# Patient Record
Sex: Female | Born: 1990 | Hispanic: Yes | Marital: Single | Smoking: Former smoker
Health system: Southern US, Community
[De-identification: ages and names within clinical notes are randomized; demographics above are authoritative.]

## PROBLEM LIST (undated history)

## (undated) ENCOUNTER — Inpatient Hospital Stay (HOSPITAL_COMMUNITY): Payer: Self-pay

## (undated) DIAGNOSIS — K6289 Other specified diseases of anus and rectum: Secondary | ICD-10-CM

## (undated) DIAGNOSIS — B999 Unspecified infectious disease: Secondary | ICD-10-CM

## (undated) DIAGNOSIS — K603 Anal fistula: Secondary | ICD-10-CM

## (undated) DIAGNOSIS — R87629 Unspecified abnormal cytological findings in specimens from vagina: Secondary | ICD-10-CM

## (undated) DIAGNOSIS — IMO0002 Reserved for concepts with insufficient information to code with codable children: Secondary | ICD-10-CM

## (undated) DIAGNOSIS — K625 Hemorrhage of anus and rectum: Secondary | ICD-10-CM

## (undated) DIAGNOSIS — K219 Gastro-esophageal reflux disease without esophagitis: Secondary | ICD-10-CM

## (undated) HISTORY — PX: LAPAROSCOPIC GASTRIC SLEEVE RESECTION: SHX5895

## (undated) HISTORY — DX: Anal fistula: K60.3

## (undated) HISTORY — DX: Hemorrhage of anus and rectum: K62.5

## (undated) HISTORY — DX: Reserved for concepts with insufficient information to code with codable children: IMO0002

## (undated) HISTORY — DX: Unspecified infectious disease: B99.9

## (undated) HISTORY — DX: Other specified diseases of anus and rectum: K62.89

## (undated) HISTORY — DX: Unspecified abnormal cytological findings in specimens from vagina: R87.629

---
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
## 2011-03-22 ENCOUNTER — Encounter: Payer: Self-pay | Admitting: Internal Medicine

## 2011-03-22 ENCOUNTER — Other Ambulatory Visit (INDEPENDENT_AMBULATORY_CARE_PROVIDER_SITE_OTHER): Payer: BC Managed Care – PPO

## 2011-03-22 ENCOUNTER — Ambulatory Visit (INDEPENDENT_AMBULATORY_CARE_PROVIDER_SITE_OTHER): Payer: BC Managed Care – PPO | Admitting: Internal Medicine

## 2011-03-22 VITALS — BP 124/72 | HR 70 | Temp 97.8°F | Resp 16 | Ht 65.0 in | Wt 256.0 lb

## 2011-03-22 DIAGNOSIS — K5900 Constipation, unspecified: Secondary | ICD-10-CM

## 2011-03-22 DIAGNOSIS — K603 Anal fistula, unspecified: Secondary | ICD-10-CM

## 2011-03-22 DIAGNOSIS — Z23 Encounter for immunization: Secondary | ICD-10-CM

## 2011-03-22 DIAGNOSIS — M26609 Unspecified temporomandibular joint disorder, unspecified side: Secondary | ICD-10-CM

## 2011-03-22 LAB — CBC WITH DIFFERENTIAL/PLATELET
Basophils Absolute: 0 10*3/uL (ref 0.0–0.1)
Basophils Relative: 0.3 % (ref 0.0–3.0)
Eosinophils Absolute: 0.1 10*3/uL (ref 0.0–0.7)
Eosinophils Relative: 1.1 % (ref 0.0–5.0)
HCT: 38.4 % (ref 36.0–46.0)
Hemoglobin: 12.7 g/dL (ref 12.0–15.0)
Lymphocytes Relative: 33.4 % (ref 12.0–46.0)
Lymphs Abs: 2.6 10*3/uL (ref 0.7–4.0)
MCHC: 33 g/dL (ref 30.0–36.0)
MCV: 87.7 fl (ref 78.0–100.0)
Monocytes Absolute: 0.6 10*3/uL (ref 0.1–1.0)
Monocytes Relative: 7 % (ref 3.0–12.0)
Neutro Abs: 4.6 10*3/uL (ref 1.4–7.7)
Neutrophils Relative %: 58.2 % (ref 43.0–77.0)
Platelets: 248 10*3/uL (ref 150.0–400.0)
RBC: 4.38 Mil/uL (ref 3.87–5.11)
RDW: 15.6 % — ABNORMAL HIGH (ref 11.5–14.6)
WBC: 7.9 10*3/uL (ref 4.5–10.5)

## 2011-03-22 LAB — COMPREHENSIVE METABOLIC PANEL
ALT: 19 U/L (ref 0–35)
AST: 19 U/L (ref 0–37)
Albumin: 3.9 g/dL (ref 3.5–5.2)
Alkaline Phosphatase: 69 U/L (ref 39–117)
BUN: 18 mg/dL (ref 6–23)
CO2: 30 mEq/L (ref 19–32)
Calcium: 8.9 mg/dL (ref 8.4–10.5)
Chloride: 103 mEq/L (ref 96–112)
Creatinine, Ser: 0.7 mg/dL (ref 0.4–1.2)
GFR: 123.04 mL/min (ref >60.00)
Glucose, Bld: 94 mg/dL (ref 70–99)
Potassium: 4.4 mEq/L (ref 3.5–5.1)
Sodium: 139 mEq/L (ref 135–145)
Total Bilirubin: 0.5 mg/dL (ref 0.3–1.2)
Total Protein: 7.3 g/dL (ref 6.0–8.3)

## 2011-03-22 LAB — TSH: TSH: 2.29 u[IU]/mL (ref 0.35–5.50)

## 2011-03-22 MED ORDER — POLYETHYLENE GLYCOL 3350 17 GM/SCOOP PO POWD: 17.0000 g | Freq: Every day | ORAL | Status: AC

## 2011-03-22 NOTE — Progress Notes (Signed)
  Subjective:    Patient ID: Marissa Saunders, female    DOB: 1991/04/30, 20 y.o.   MRN: [id number]  Constipation This is a chronic problem. The current episode started more than 1 year ago. The problem is unchanged. Her stool frequency is 1 time per day. The stool is described as firm. The patient is on a high fiber diet. She does not exercise regularly. There has been adequate water intake. Associated symptoms include hematochezia, hemorrhoids and rectal pain. Pertinent negatives include no abdominal pain, anorexia, back pain, bloating, diarrhea, difficulty urinating, fecal incontinence, fever, flatus, melena, nausea, vomiting or weight loss. Risk factors include obesity. She has tried laxatives for the symptoms. The treatment provided mild relief.      Review of Systems  Constitutional: Negative for fever and weight loss.  HENT: Positive for dental problem (tmj is painful, it locks and it clicks). Negative for hearing loss, ear pain, nosebleeds, congestion, sore throat, facial swelling, rhinorrhea, sneezing, drooling, mouth sores, trouble swallowing, neck pain, neck stiffness, voice change, postnasal drip, sinus pressure, tinnitus and ear discharge.   Gastrointestinal: Positive for constipation, blood in stool, hematochezia, anal bleeding, rectal pain and hemorrhoids. Negative for nausea, vomiting, abdominal pain, diarrhea, melena, abdominal distention, bloating, anorexia and flatus.  Genitourinary: Negative for difficulty urinating.  Musculoskeletal: Negative for back pain.       Objective:   Physical Exam  Vitals reviewed. Constitutional: She is oriented to person, place, and time. She appears well-developed and well-nourished. No distress.  HENT:  Mouth/Throat: Oropharynx is clear and moist and mucous membranes are normal. Mucous membranes are not pale, not dry and not cyanotic. No oropharyngeal exudate, posterior oropharyngeal edema, posterior oropharyngeal erythema or tonsillar abscesses.        There is crepitance and popping in both TMJ's but she has good ROM  Eyes: Conjunctivae are normal. Right eye exhibits no discharge. Left eye exhibits no discharge. No scleral icterus.  Neck: Normal range of motion. Neck supple. No JVD present. No tracheal deviation present. No thyromegaly present.  Cardiovascular: Normal rate, regular rhythm, normal heart sounds and intact distal pulses.  Exam reveals no gallop and no friction rub.   No murmur heard. Pulmonary/Chest: Effort normal and breath sounds normal. No stridor. No respiratory distress. She has no wheezes. She has no rales. She exhibits no tenderness.  Abdominal: Soft. Normal appearance and bowel sounds are normal. She exhibits no distension and no mass. There is no hepatosplenomegaly. There is no tenderness. There is no rebound, no guarding and no CVA tenderness.  Genitourinary: Rectal exam shows internal hemorrhoid and fissure (fistula from the posterior peri-anal area). Rectal exam shows no external hemorrhoid, no mass, no tenderness and anal tone normal. Guaiac negative stool. Right adnexum displays no mass, no tenderness and no fullness. Left adnexum displays no mass, no tenderness and no fullness.  Musculoskeletal: Normal range of motion. She exhibits no edema and no tenderness.  Lymphadenopathy:    She has no cervical adenopathy.  Neurological: She is oriented to person, place, and time. She displays normal reflexes. She exhibits normal muscle tone. Coordination normal.  Skin: Skin is warm and dry. No rash noted. She is not diaphoretic. No erythema. No pallor.  Psychiatric: She has a normal mood and affect. Her behavior is normal. Judgment and thought content normal.          Assessment & Plan:

## 2011-03-22 NOTE — Assessment & Plan Note (Signed)
Start miralax and check labs to look for secondary causes 

## 2011-03-22 NOTE — Assessment & Plan Note (Signed)
General surgery referral to see if she needs a repair

## 2011-03-22 NOTE — Patient Instructions (Signed)

## 2011-03-22 NOTE — Assessment & Plan Note (Signed)
Oral surgery referral

## 2011-04-03 ENCOUNTER — Ambulatory Visit (INDEPENDENT_AMBULATORY_CARE_PROVIDER_SITE_OTHER): Payer: BC Managed Care – PPO | Admitting: Surgery

## 2011-04-03 ENCOUNTER — Encounter (INDEPENDENT_AMBULATORY_CARE_PROVIDER_SITE_OTHER): Payer: Self-pay | Admitting: Surgery

## 2011-04-03 VITALS — BP 126/78 | HR 60 | Temp 97.2°F | Resp 18 | Ht 65.0 in | Wt 257.5 lb

## 2011-04-03 DIAGNOSIS — K602 Anal fissure, unspecified: Secondary | ICD-10-CM

## 2011-04-03 DIAGNOSIS — K603 Anal fistula: Secondary | ICD-10-CM

## 2011-04-03 DIAGNOSIS — K601 Chronic anal fissure: Secondary | ICD-10-CM | POA: Insufficient documentation

## 2011-04-03 NOTE — Progress Notes (Signed)
Chief Complaint  Patient presents with  . Other    new pt eval of anal fissure     HPI Marissa Saunders is a 20 y.o. female.  HPI The patient is sent today at the request of Dr. Yetta Barre due to an anal fissure. She has a one-year history of rectal bleeding, constipation, intermittent drainage and intermittent pain in her anal canal. She denies any fever or chills. She states she had a small mass on the outside of her anus about a year ago which began to bleed in drain. This has gotten better but she has persistent pain after bowel movements and bleeding after bowel movements.  Past Medical History  Diagnosis Date  . Anal bleeding   . Rectal pain   . Constipation     History reviewed. No pertinent past surgical history.  Family History  Problem Relation Age of Onset  . Hypertension Mother   . Diabetes Mother   . Hyperlipidemia Father   . Hypertension Father   . Cancer Neg Hx     Social History History  Substance Use Topics  . Smoking status: Never Smoker   . Smokeless tobacco: Never Used  . Alcohol Use: 3.0 oz/week    5 Shots of liquor per week    No Known Allergies  Current Outpatient Prescriptions  Medication Sig Dispense Refill  . Polyethylene Glycol 3350 (MIRALAX PO) Take by mouth daily.          Review of Systems Review of Systems  Constitutional: Negative.   HENT: Negative.   Eyes: Negative.   Respiratory: Negative.   Cardiovascular: Negative.   Gastrointestinal: Negative.   Genitourinary: Negative.   Musculoskeletal: Negative.   Skin: Negative.   Neurological: Negative.   Hematological: Negative.   Psychiatric/Behavioral: Negative.     Blood pressure 126/78, pulse 60, temperature 97.2 F (36.2 C), resp. rate 18, height 5\' 5"  (1.651 m), weight 257 lb 8 oz (116.801 kg), last menstrual period 03/08/2011.  Physical Exam Physical Exam  Constitutional: She is oriented to person, place, and time. She appears well-developed and well-nourished.  HENT:    Head: Normocephalic and atraumatic.  Eyes: Conjunctivae and EOM are normal. Pupils are equal, round, and reactive to light.  Neck: Normal range of motion. Neck supple.  Cardiovascular: Normal rate, regular rhythm and normal heart sounds.   Pulmonary/Chest: Effort normal and breath sounds normal.  Genitourinary:       Posterior midline anal fissure noted.  Chronic.  Posterior opening is skin consistent with fistula in ano.  No redness or mass.skin tags noted.  Neurological: She is alert and oriented to person, place, and time.  Skin: Skin is warm and dry.  Psychiatric: She has a normal mood and affect. Her behavior is normal. Judgment and thought content normal.    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 The procedure has been discussed with the patient. The risks,  benefits and alternatives to surgery have been discussed. The patient understands the reason for the procedure and agrees to proceed. All questions are answered today to the best of my ability. Complications of bleeding, infection,  Recurrence,  Incontinence and more surgery. She agrees to proceed.       Machai Desmith A. 04/03/2011, 4:57 PM

## 2011-04-03 NOTE — Patient Instructions (Signed)
Anal Fissure     An anal fissure often begins with sharp pain. This is usually following a bowel movement. It often causes bright red blood stained stools. It is the most common cause of rectal bleeding. One common cause of this is passage of a large, hard stool. It can also be caused by having frequent diarrheal stools. Anal fissures that occur for a longtime (chronic) may require surgery.     CAUSES   Passing large, hard stools.   Frequent diarrheal stools.   Constipation.     SYMPTOMS   Bright red, blood stained stools.   Rectal bleeding.     HOME CARE INSTRUCTIONS   If constipation is the cause of the rectal fissure, it may be necessary to add a bulk-forming laxative. A diet high in fruits, whole grains, and vegetables will also help.   Taking hot sitz baths for 1 half hour 4 times per day may help.   Increase your fluid intake.   Only take over-the-counter or prescription medicines for pain, discomfort, or fever as directed by your caregiver. Do not take aspirin as this may increase the tendency for bleeding.   Do not use ointments containing anesthetic medications or hydrocortisone. They could slow healing.   Avoid constipating foods such as bananas and cheese.   In children, brown Karo syrup may be used by adding 1 teaspoon of syrup to 8 ounces of formula. An alternative is to give 3 teaspoons of mineral oil every day.     SEEK MEDICAL CARE IF:  Rectal bleeding continues, changes in intensity, or becomes more severe.     MAKE SURE YOU:     Understand these instructions.     Will watch your condition.   Will get help right away if you are not doing well or get worse.     Document Released: 06/17/2005  Document Re-Released: 09/11/2009  Exit[employer] Patient Information 2011 [employer].

## 2011-05-02 DIAGNOSIS — K603 Anal fistula, unspecified: Secondary | ICD-10-CM

## 2011-05-02 DIAGNOSIS — K602 Anal fissure, unspecified: Secondary | ICD-10-CM

## 2011-05-02 HISTORY — PX: ANAL FISSURE REPAIR: SHX2312

## 2011-05-06 ENCOUNTER — Telehealth (INDEPENDENT_AMBULATORY_CARE_PROVIDER_SITE_OTHER): Payer: Self-pay | Admitting: Surgery

## 2011-05-07 NOTE — Telephone Encounter (Signed)
APPOINTMENT MADE WITH DR. Luisa Hart ON 05-17-11./ PT AWARE

## 2011-05-08 ENCOUNTER — Telehealth (INDEPENDENT_AMBULATORY_CARE_PROVIDER_SITE_OTHER): Payer: Self-pay

## 2011-05-08 NOTE — Telephone Encounter (Signed)
Pt called with questions [id number]. Pt states still having drainage and small amount of bleeding. No fever. No swelling. Pt advised that some drainage and spotting is common after fistulotomy. Pt advised if large amount of bleeding, swelling,fever or increase in pain pt needs to call and advise our office. Pt to continue tub soaks,using wet wipes.

## 2011-05-17 ENCOUNTER — Ambulatory Visit (INDEPENDENT_AMBULATORY_CARE_PROVIDER_SITE_OTHER): Payer: BC Managed Care – PPO | Admitting: Surgery

## 2011-05-17 ENCOUNTER — Encounter (INDEPENDENT_AMBULATORY_CARE_PROVIDER_SITE_OTHER): Payer: Self-pay | Admitting: Surgery

## 2011-05-17 VITALS — BP 126/72 | HR 68 | Temp 97.5°F | Resp 16 | Ht 65.0 in | Wt 252.4 lb

## 2011-05-17 DIAGNOSIS — Z9889 Other specified postprocedural states: Secondary | ICD-10-CM

## 2011-05-17 NOTE — Progress Notes (Signed)
The patient returns today after fistulectomy for a fistula in ano. She does have some mild incontinence gas. She is able to control her bowel function otherwise.  Exam: Fistulotomy site clean dry and intact. Minimal fibrinous exudate. No redness or abscess.  Impression: Status post fistulotomy for fistula in ano  Plan: Return to clinic one month. Decreased amount of carbonation and diet. Continue local wound care.

## 2011-05-17 NOTE — Patient Instructions (Signed)
Follow up 1 month.  Excessive flatus will resolve in next 2 -3 months.  Avoid excesive carbonation.

## 2011-06-12 ENCOUNTER — Encounter (INDEPENDENT_AMBULATORY_CARE_PROVIDER_SITE_OTHER): Payer: Self-pay | Admitting: Surgery

## 2011-06-12 ENCOUNTER — Ambulatory Visit (INDEPENDENT_AMBULATORY_CARE_PROVIDER_SITE_OTHER): Payer: BC Managed Care – PPO | Admitting: Surgery

## 2011-06-12 VITALS — BP 142/98 | HR 64 | Temp 96.9°F | Resp 19 | Ht 65.0 in | Wt 245.4 lb

## 2011-06-12 DIAGNOSIS — Z9889 Other specified postprocedural states: Secondary | ICD-10-CM

## 2011-06-12 NOTE — Progress Notes (Signed)
Patient returns after fistulotomy or fistula in ano. She is doing well.  Exam: Fistulotomy wound healing well. No signs of infection.  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

## 2011-06-12 NOTE — Patient Instructions (Signed)
Expect drainage for 2- 3 more months.  Return as needed.

## 2012-02-12 ENCOUNTER — Encounter: Payer: Self-pay | Admitting: Obstetrics and Gynecology

## 2012-03-17 ENCOUNTER — Ambulatory Visit (INDEPENDENT_AMBULATORY_CARE_PROVIDER_SITE_OTHER): Payer: BC Managed Care – PPO | Admitting: Obstetrics and Gynecology

## 2012-03-17 DIAGNOSIS — Z331 Pregnant state, incidental: Secondary | ICD-10-CM

## 2012-03-17 DIAGNOSIS — Z3689 Encounter for other specified antenatal screening: Secondary | ICD-10-CM

## 2012-03-17 LAB — POCT URINALYSIS DIPSTICK
Bilirubin, UA: NEGATIVE
Blood, UA: NEGATIVE
Glucose, UA: NEGATIVE
Ketones, UA: 1
Nitrite, UA: NEGATIVE
Protein, UA: NEGATIVE
Spec Grav, UA: 1.025
Urobilinogen, UA: NEGATIVE
pH, UA: 5

## 2012-03-17 LAB — OB RESULTS CONSOLE GBS: GBS: POSITIVE

## 2012-03-17 NOTE — Progress Notes (Signed)
NOB interview completed.  Pt is [redacted]w[redacted]d today, no previous prenatal care.  Scheduled for NOB work up on Friday 03/20/12 w/ VL, scheduled anatomy scan for Friday 03/27/12 @ 1330 f/u w/ DD.  Quad screen drawn today.

## 2012-03-18 LAB — PRENATAL PANEL VII
Antibody Screen: NEGATIVE
Basophils Absolute: 0 10*3/uL (ref 0.0–0.1)
Basophils Relative: 0 % (ref 0–1)
Eosinophils Absolute: 0.1 10*3/uL (ref 0.0–0.7)
Eosinophils Relative: 1 % (ref 0–5)
HCT: 34.8 % — ABNORMAL LOW (ref 36.0–46.0)
HIV: NONREACTIVE
Hemoglobin: 11.6 g/dL — ABNORMAL LOW (ref 12.0–15.0)
Hepatitis B Surface Ag: NEGATIVE
Lymphocytes Relative: 26 % (ref 12–46)
Lymphs Abs: 2.6 10*3/uL (ref 0.7–4.0)
MCH: 28.9 pg (ref 26.0–34.0)
MCHC: 33.3 g/dL (ref 30.0–36.0)
MCV: 86.6 fL (ref 78.0–100.0)
Monocytes Absolute: 0.7 10*3/uL (ref 0.1–1.0)
Monocytes Relative: 7 % (ref 3–12)
Neutro Abs: 6.7 10*3/uL (ref 1.7–7.7)
Neutrophils Relative %: 66 % (ref 43–77)
Platelets: 240 10*3/uL (ref 150–400)
RBC: 4.02 MIL/uL (ref 3.87–5.11)
RDW: 13.5 % (ref 11.5–15.5)
Rh Type: POSITIVE
Rubella: 120.9 IU/mL — ABNORMAL HIGH
WBC: 10.1 10*3/uL (ref 4.0–10.5)

## 2012-03-18 LAB — AFP, QUAD SCREEN
AFP: 34.1 IU/mL
Age Alone: 1:1160 {titer}
Curr Gest Age: 18.5 wks.days
Down Syndrome Scr Risk Est: 1:3200 {titer}
HCG, Total: 19950 m[IU]/mL
INH: 134.3 pg/mL
Interpretation-AFP: NEGATIVE
MoM for AFP: 1.29
MoM for INH: 1.17
MoM for hCG: 1.64
Open Spina bifida: NEGATIVE
Osb Risk: 1:5160 {titer}
Tri 18 Scr Risk Est: NEGATIVE
Trisomy 18 (Edward) Syndrome Interp.: 1:25600 {titer}
uE3 Mom: 0.58
uE3 Value: 0.5 ng/mL

## 2012-03-19 LAB — HEMOGLOBINOPATHY EVALUATION
Hemoglobin Other: 0 %
Hgb A2 Quant: 3.2 % (ref 2.2–3.2)
Hgb A: 96.8 % (ref 96.8–97.8)
Hgb F Quant: 0 % (ref 0.0–2.0)
Hgb S Quant: 0 %

## 2012-03-20 ENCOUNTER — Ambulatory Visit (INDEPENDENT_AMBULATORY_CARE_PROVIDER_SITE_OTHER): Payer: BC Managed Care – PPO | Admitting: Obstetrics and Gynecology

## 2012-03-20 ENCOUNTER — Encounter: Payer: Self-pay | Admitting: Obstetrics and Gynecology

## 2012-03-20 VITALS — BP 132/62 | Wt 280.0 lb

## 2012-03-20 DIAGNOSIS — Z6838 Body mass index (BMI) 38.0-38.9, adult: Secondary | ICD-10-CM

## 2012-03-20 DIAGNOSIS — IMO0002 Reserved for concepts with insufficient information to code with codable children: Secondary | ICD-10-CM

## 2012-03-20 DIAGNOSIS — Z331 Pregnant state, incidental: Secondary | ICD-10-CM

## 2012-03-20 DIAGNOSIS — Z22330 Carrier of Group B streptococcus: Secondary | ICD-10-CM | POA: Insufficient documentation

## 2012-03-20 LAB — CULTURE, OB URINE: Colony Count: 5000

## 2012-03-20 NOTE — Progress Notes (Signed)
[redacted]w[redacted]d New OB

## 2012-03-20 NOTE — Addendum Note (Signed)
Addended by: Darien Ramus on: 03/20/2012 03:18 PM   Modules accepted: Orders

## 2012-03-20 NOTE — Progress Notes (Signed)
Subjective:    Marissa Saunders is being seen today for her first obstetrical visit at [redacted]w[redacted]d gestation by LMP.  No previous care.  Has [place] for anatomy scheduled 03/27/12. Quad screen done at NOB interview--WNL.  Had +GBS by urine culture at NOB interview--low colony count, will Rx in labor.  Patient Active Problem List  Diagnosis  . TMJ (temporomandibular joint disorder)  . Constipation  . Fistula, anal  . Chronic anal fissure  . Fistula-in-ano  . GBS carrier     She reports doing well, no c/o.  Occasional lower abdominal "pulling" sensation, but very sporadic. No bleeding.  Her obstetrical history is significant for: Patient Active Problem List  Diagnosis  . TMJ (temporomandibular joint disorder)  . Constipation  . Fistula, anal  . Chronic anal fissure  . Fistula-in-ano  . GBS carrier  No current issues with anal fistula.  Relationship with FOB:  Boyfriend.  Patient is not sure of plan for infant feeding.  Pregnancy history fully reviewed.  The following portions of the patient's history were reviewed and updated as appropriate: allergies, current medications, past family history, past medical history, past social history, past surgical history and problem list.  Review of Systems Pertinent ROS is described in HPI   Objective:   LMP 11/07/2011 Wt Readings from Last 1 Encounters:  06/12/11 245 lb 6.4 oz (111.313 kg)   BMI: There is no height or weight on file to calculate BMI.  General: alert, cooperative and no distress Respiratory: clear to auscultation bilaterally Cardiovascular: regular rate and rhythm, S1, S2 normal, no murmur Breasts:  No dominant masses, nipples erect Gastrointestinal: soft, non-tender; no masses,  no organomegaly Extremities: extremities normal, no pain or edema Vaginal Bleeding: None  EXTERNAL GENITALIA: normal appearing vulva with no masses, tenderness or lesions VAGINA: no abnormal discharge or lesions CERVIX: no lesions or cervical  motion tenderness; cervix closed, long, firm UTERUS: gravid and consistent with 19 weeks ADNEXA: no masses palpable and nontender OB EXAM PELVIMETRY: appears adequate Significant pannus.   FHR:  150  bpm  Assessment:    Pregnancy at  19 1/7 weeks Late to care +GBS bacteruria Elevated BMI Plan:     Prenatal panel reviewed and discussed with the patient:yes Pap smear collected:yes GC/Chlamydia collected:yes Wet prep:  Neg Discussion of Genetic testing options: Had Quad screen--WNL Prenatal vitamins recommended Problem list reviewed and updated.  Plan of care: Follow up in 1 week for anatomy US and glucola.  Nigel Bridgeman CNM, [place] 03/20/2012 12:44 PM

## 2012-03-23 ENCOUNTER — Encounter: Payer: Self-pay | Admitting: Obstetrics and Gynecology

## 2012-03-24 LAB — PAP IG, CT-NG, RFX HPV ASCU
Chlamydia Probe Amp: NEGATIVE
GC Probe Amp: NEGATIVE

## 2012-03-25 ENCOUNTER — Encounter: Payer: Self-pay | Admitting: Obstetrics and Gynecology

## 2012-03-25 DIAGNOSIS — O0930 Supervision of pregnancy with insufficient antenatal care, unspecified trimester: Secondary | ICD-10-CM | POA: Insufficient documentation

## 2012-03-25 DIAGNOSIS — R87612 Low grade squamous intraepithelial lesion on cytologic smear of cervix (LGSIL): Secondary | ICD-10-CM

## 2012-03-25 DIAGNOSIS — IMO0002 Reserved for concepts with insufficient information to code with codable children: Secondary | ICD-10-CM

## 2012-03-25 HISTORY — DX: Low grade squamous intraepithelial lesion on cytologic smear of cervix (LGSIL): R87.612

## 2012-03-25 HISTORY — DX: Reserved for concepts with insufficient information to code with codable children: IMO0002

## 2012-03-26 ENCOUNTER — Telehealth: Payer: Self-pay

## 2012-03-27 ENCOUNTER — Telehealth: Payer: Self-pay

## 2012-03-27 ENCOUNTER — Other Ambulatory Visit: Payer: BC Managed Care – PPO

## 2012-03-27 ENCOUNTER — Ambulatory Visit (INDEPENDENT_AMBULATORY_CARE_PROVIDER_SITE_OTHER): Payer: BC Managed Care – PPO

## 2012-03-27 ENCOUNTER — Ambulatory Visit (INDEPENDENT_AMBULATORY_CARE_PROVIDER_SITE_OTHER): Payer: BC Managed Care – PPO | Admitting: Obstetrics and Gynecology

## 2012-03-27 ENCOUNTER — Encounter: Payer: Self-pay | Admitting: Obstetrics and Gynecology

## 2012-03-27 VITALS — BP 112/62 | Wt 279.0 lb

## 2012-03-27 DIAGNOSIS — Z3689 Encounter for other specified antenatal screening: Secondary | ICD-10-CM

## 2012-03-27 DIAGNOSIS — Z331 Pregnant state, incidental: Secondary | ICD-10-CM

## 2012-03-27 LAB — US OB COMP + 14 WK

## 2012-03-27 NOTE — Progress Notes (Signed)
[redacted]w[redacted]d Vx presentation, Posterior Fundal placenta. Placenta Edge is 8.0 cm from internal os normal (Fluid is normal) Marginal Placenta Cord insertion and Hypocoiled Umbilical cord is noted. Cardiac activity noted. No anomalies seen.Feet seen female gender. No oavaries, No fluid in CDS, Normal Adnexas Suggest follow up at 28 weeks for linear growth. No complaints. No change in Vaginal secretions.

## 2012-03-27 NOTE — Telephone Encounter (Signed)
Message copied by Janeece Agee on Fri Mar 27, 2012  3:29 PM ------      Message from: Cornelius Moras      Created: Wed Mar 25, 2012  5:46 AM       Needs colpo      Thanks!

## 2012-03-27 NOTE — Telephone Encounter (Signed)
Unable to lm on vm

## 2012-03-27 NOTE — Progress Notes (Signed)
Pt states no concerns today.   

## 2012-03-27 NOTE — Progress Notes (Incomplete)
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

## 2012-04-01 NOTE — Telephone Encounter (Signed)
Spoke with pt informing her colposcopy is needed due to abn pap smear. Appt scheduled 04/13/12 Oct 14th with AVS. Pt agrees and voices understanding.

## 2012-04-13 ENCOUNTER — Encounter: Payer: Self-pay | Admitting: Obstetrics and Gynecology

## 2012-04-13 ENCOUNTER — Ambulatory Visit (INDEPENDENT_AMBULATORY_CARE_PROVIDER_SITE_OTHER): Payer: Medicaid Other | Admitting: Obstetrics and Gynecology

## 2012-04-13 ENCOUNTER — Ambulatory Visit: Payer: BC Managed Care – PPO

## 2012-04-13 VITALS — BP 124/68 | Wt 288.0 lb

## 2012-04-13 DIAGNOSIS — R6889 Other general symptoms and signs: Secondary | ICD-10-CM

## 2012-04-13 DIAGNOSIS — B977 Papillomavirus as the cause of diseases classified elsewhere: Secondary | ICD-10-CM

## 2012-04-13 DIAGNOSIS — IMO0002 Reserved for concepts with insufficient information to code with codable children: Secondary | ICD-10-CM

## 2012-04-13 NOTE — Progress Notes (Signed)
HISTORY OF PRESENT ILLNESS  [name] is a 21 y.o. year old female,G1P0, who presents for a problem visit. The patient presents at 22 weeks and 4 days gestation.  Her Pap smear showed CIN-1 with HPV.  Subjective:  Doing well  Objective:  BP 124/68  Wt 288 lb (130.636 kg)  LMP 11/07/2011   GI: soft and nontender, gravid, fundal height 23 cm, normal fetal heart tones  Exam deferred.  Assessment:  Pap smear with CIN-1  HPV  22 week and 4 day gestation  Plan:  A long discussion was held with the patient about abnormal Pap smears and HPV.  I still recommend Gardasil vaccination.  Repeat Pap smear per new guidelines in September 2014.  Return to office in 2 week(s).   Leonard Schwartz M.D.  04/13/2012 2:10 PM    Murrell Converse Pap Smear: 03/20/12 LSIL MILD DYSPLASIA CIN1  Previous Colposcopy: n/a Referred From: n/a LMP: 11/07/11 Contraception: none, currently pregnant  G,P: 1, 0 Pt states no complaints with pregnancy, no swelling, bleeding, or discharge. Pt states baby's movement is normal. Urine negative for protein and glucose. FHT 140

## 2012-04-24 ENCOUNTER — Encounter: Payer: Self-pay | Admitting: Obstetrics and Gynecology

## 2012-04-24 ENCOUNTER — Ambulatory Visit (INDEPENDENT_AMBULATORY_CARE_PROVIDER_SITE_OTHER): Payer: Medicaid Other | Admitting: Obstetrics and Gynecology

## 2012-04-24 VITALS — BP 120/60 | Wt 290.0 lb

## 2012-04-24 DIAGNOSIS — IMO0002 Reserved for concepts with insufficient information to code with codable children: Secondary | ICD-10-CM

## 2012-04-24 DIAGNOSIS — R6889 Other general symptoms and signs: Secondary | ICD-10-CM

## 2012-04-24 DIAGNOSIS — Z331 Pregnant state, incidental: Secondary | ICD-10-CM

## 2012-04-24 DIAGNOSIS — Z23 Encounter for immunization: Secondary | ICD-10-CM

## 2012-04-24 NOTE — Progress Notes (Signed)
[redacted]w[redacted]d No complaints per pt  Flu shot given without difficulty

## 2012-04-24 NOTE — Addendum Note (Signed)
Addended by: Janeece Agee on: 04/24/2012 12:51 PM   Modules accepted: Orders

## 2012-04-24 NOTE — Progress Notes (Signed)
Doing well. Reviewed weight gain from LV--glucola [place] at [facility] for growth/fluid due to hypercoiled cord and marginal insertion. Reviewed plan per AVS for pap in 03/2013.

## 2012-05-05 ENCOUNTER — Telehealth: Payer: Self-pay | Admitting: Obstetrics and Gynecology

## 2012-05-05 ENCOUNTER — Ambulatory Visit (INDEPENDENT_AMBULATORY_CARE_PROVIDER_SITE_OTHER): Payer: Medicaid Other | Admitting: Obstetrics and Gynecology

## 2012-05-05 ENCOUNTER — Encounter: Payer: Self-pay | Admitting: Obstetrics and Gynecology

## 2012-05-05 VITALS — BP 124/70 | Wt 297.0 lb

## 2012-05-05 DIAGNOSIS — O26899 Other specified pregnancy related conditions, unspecified trimester: Secondary | ICD-10-CM

## 2012-05-05 DIAGNOSIS — N949 Unspecified condition associated with female genital organs and menstrual cycle: Secondary | ICD-10-CM

## 2012-05-05 DIAGNOSIS — Z331 Pregnant state, incidental: Secondary | ICD-10-CM

## 2012-05-05 DIAGNOSIS — R102 Pelvic and perineal pain: Secondary | ICD-10-CM

## 2012-05-05 LAB — POCT URINALYSIS DIPSTICK
Bilirubin, UA: NEGATIVE
Glucose, UA: NEGATIVE
Ketones, UA: NEGATIVE
Nitrite, UA: NEGATIVE
Protein, UA: NEGATIVE
Spec Grav, UA: 1.01
Urobilinogen, UA: NEGATIVE
pH, UA: 7

## 2012-05-05 NOTE — Progress Notes (Signed)
Pt states sometimes she feels like her bladder is full but only has a small amount of urine when using the restroom. Pt also c/o more frequent urination and discomfort when urinating.

## 2012-05-05 NOTE — Progress Notes (Signed)
[redacted]w[redacted]d Patient complains of urinary frequency.  UA shows 1+ leukocytes.  Urine culture sent. Doing okay otherwise. Return to office for Glucola next visit.  Dr. Stefano Gaul

## 2012-05-05 NOTE — Telephone Encounter (Signed)
Tc to pt. Pt with increased urinary freq and urgency since last pm. Pt with h/o UTI and "feels like she has one". No dysuria. No fever. No lower abd pain. No contractions. Positive fetal movement. Appt sched today @ 2:30p with AVS for eval. Pt agrees.

## 2012-05-06 ENCOUNTER — Inpatient Hospital Stay (HOSPITAL_COMMUNITY)
Admission: AD | Admit: 2012-05-06 | Discharge: 2012-05-06 | Disposition: A | Discharge status: Home / Self Care | Payer: Medicaid Other | Source: Ambulatory Visit | Attending: Obstetrics and Gynecology | Admitting: Obstetrics and Gynecology

## 2012-05-06 DIAGNOSIS — O0930 Supervision of pregnancy with insufficient antenatal care, unspecified trimester: Secondary | ICD-10-CM

## 2012-05-06 DIAGNOSIS — R87612 Low grade squamous intraepithelial lesion on cytologic smear of cervix (LGSIL): Secondary | ICD-10-CM

## 2012-05-06 DIAGNOSIS — IMO0002 Reserved for concepts with insufficient information to code with codable children: Secondary | ICD-10-CM

## 2012-05-06 DIAGNOSIS — R300 Dysuria: Secondary | ICD-10-CM | POA: Insufficient documentation

## 2012-05-06 DIAGNOSIS — Z6838 Body mass index (BMI) 38.0-38.9, adult: Secondary | ICD-10-CM

## 2012-05-06 DIAGNOSIS — Z22330 Carrier of Group B streptococcus: Secondary | ICD-10-CM

## 2012-05-06 DIAGNOSIS — R350 Frequency of micturition: Secondary | ICD-10-CM | POA: Insufficient documentation

## 2012-05-06 LAB — URINALYSIS, ROUTINE W REFLEX MICROSCOPIC
Bilirubin Urine: NEGATIVE
Glucose, UA: NEGATIVE mg/dL
Ketones, ur: NEGATIVE mg/dL
Nitrite: NEGATIVE
Protein, ur: 100 mg/dL — AB
Specific Gravity, Urine: 1.025 (ref 1.005–1.030)
Urobilinogen, UA: 0.2 mg/dL (ref 0.0–1.0)
pH: 7 (ref 5.0–8.0)

## 2012-05-06 LAB — URINE MICROSCOPIC-ADD ON

## 2012-05-06 LAB — POCT PREGNANCY, URINE
Preg Test, Ur: POSITIVE — AB
Preg Test, Ur: POSITIVE — AB

## 2012-05-06 MED ORDER — NITROFURANTOIN MONOHYD MACRO 100 MG PO CAPS: 100.0000 mg | ORAL_CAPSULE | Freq: Two times a day (BID) | ORAL | Status: DC

## 2012-05-06 NOTE — Progress Notes (Signed)
Pt arrived to MAU w c/o dysuria and pressure. Pt is 21yo G1P0 at [redacted]w[redacted]d Denies any VB, LOF, ctx. +FM   Was seen in office yesterday by Dr Stefano Gaul and UA sent for cx Results for orders placed during the hospital encounter of 05/06/12 (from the past 24 hour(s))  URINALYSIS, ROUTINE W REFLEX MICROSCOPIC     Status: Abnormal   Collection Time   05/06/12 12:35 PM      Component Value Range   Color, Urine YELLOW  YELLOW   APPearance HAZY (*) CLEAR   Specific Gravity, Urine 1.025  1.005 - 1.030   pH 7.0  5.0 - 8.0   Glucose, UA NEGATIVE  NEGATIVE mg/dL   Hgb urine dipstick LARGE (*) NEGATIVE   Bilirubin Urine NEGATIVE  NEGATIVE   Ketones, ur NEGATIVE  NEGATIVE mg/dL   Protein, ur 161 (*) NEGATIVE mg/dL   Urobilinogen, UA 0.2  0.0 - 1.0 mg/dL   Nitrite NEGATIVE  NEGATIVE   Leukocytes, UA TRACE (*) NEGATIVE  URINE MICROSCOPIC-ADD ON     Status: Abnormal   Collection Time   05/06/12 12:35 PM      Component Value Range   Squamous Epithelial / LPF FEW (*) RARE   WBC, UA 21-50  <3 WBC/hpf   RBC / HPF 7-10  <3 RBC/hpf   Bacteria, UA FEW (*) RARE  POCT PREGNANCY, URINE     Status: Abnormal   Collection Time   05/06/12 12:41 PM      Component Value Range   Preg Test, Ur POSITIVE (*) NEGATIVE  POCT PREGNANCY, URINE     Status: Abnormal   Collection Time   05/06/12 12:41 PM      Component Value Range   Preg Test, Ur POSITIVE (*) NEGATIVE   PE:  Gen: A&O NAD Lungs CTAB Heart RRR Abdomen Soft, NT +FHT's  Pelvic deferred Neg CVAT   A: UTI  VSS  P: macrobid 100mg  PO BID x7 days, will look for culture results  Pt may take AZO for discomfort, also motrin 600mg  PRN RTO w fever, unresolved sx's, cramping or bleeding F/u office routine next week, will need to repeat UA TOC in 3-4wks   S.Ferry Matthis, CNM

## 2012-05-06 NOTE — MAU Note (Signed)
Frequency, urgency, pain and pressure with urination.  Sometimes has urge but then nothing comes out.  Started 2 days. Ago. Neg cva tenderness.

## 2012-05-08 LAB — URINE CULTURE: Colony Count: >=100000

## 2012-05-08 LAB — CULTURE, OB URINE: Colony Count: >=100000

## 2012-05-14 NOTE — Telephone Encounter (Signed)
Note to close encounter. Marissa Saunders, [name]  

## 2012-05-22 ENCOUNTER — Encounter: Payer: Self-pay | Admitting: Obstetrics and Gynecology

## 2012-05-22 ENCOUNTER — Ambulatory Visit (INDEPENDENT_AMBULATORY_CARE_PROVIDER_SITE_OTHER): Payer: Medicaid Other | Admitting: Obstetrics and Gynecology

## 2012-05-22 ENCOUNTER — Ambulatory Visit (INDEPENDENT_AMBULATORY_CARE_PROVIDER_SITE_OTHER): Payer: Medicaid Other

## 2012-05-22 VITALS — BP 134/72 | Wt 307.0 lb

## 2012-05-22 DIAGNOSIS — IMO0002 Reserved for concepts with insufficient information to code with codable children: Secondary | ICD-10-CM

## 2012-05-22 DIAGNOSIS — Z331 Pregnant state, incidental: Secondary | ICD-10-CM

## 2012-05-22 NOTE — Progress Notes (Signed)
[redacted]w[redacted]d Ultrasound: AGA with normal AFI Pt left without being seen

## 2012-05-22 NOTE — Progress Notes (Signed)
103w1d Glucola Today Ultrasound shows:  SIUP  S=D     [place] EDD: *08/13/2012           EFW: 2 lb 13 oz 51%           AFI: 13.9 cm           Cervical length: 3.33 cm           Placenta localization: fundal, posterior           Fetal presentation: vertex Comments: marginal cord insertion appears stable. Normal fluid. AFI of 13.9 cm = 45th%. Normal linear growth: EFW = 63rd%. No late presenting fetal abnormality. Cx closed.

## 2012-05-23 LAB — GLUCOSE TOLERANCE, 1 HOUR (50G) W/O FASTING: Glucose, 1 Hour GTT: 105 mg/dL (ref 70–140)

## 2012-05-23 LAB — HEMOGLOBIN: Hemoglobin: 12.2 g/dL (ref 12.0–15.0)

## 2012-05-23 LAB — RPR

## 2012-05-25 ENCOUNTER — Other Ambulatory Visit: Payer: Self-pay

## 2012-05-25 ENCOUNTER — Telehealth: Payer: Self-pay

## 2012-05-25 LAB — US OB FOLLOW UP

## 2012-05-25 MED ORDER — CEPHALEXIN 500 MG PO CAPS: 500.0000 mg | ORAL_CAPSULE | Freq: Three times a day (TID) | ORAL | Status: DC

## 2012-05-25 NOTE — Telephone Encounter (Signed)
Per AVS call in keflex 500mg  3 times each day for 7 days, 21 tablets, no refills.   Pt made aware Pt agreeable  LC CMA

## 2012-06-02 ENCOUNTER — Telehealth: Payer: Self-pay | Admitting: Obstetrics and Gynecology

## 2012-06-02 NOTE — Telephone Encounter (Signed)
Spoke with pt c/o vagina swelling. Pt states she completed Keflex yesterday & now notices cuts & swelling in her vagina area. Appt offered today with SL at 1:15 pm; however, pt declined due her work scheduled. Appt scheduled tomorrow at 8:45 am with ND.

## 2012-06-03 ENCOUNTER — Ambulatory Visit (INDEPENDENT_AMBULATORY_CARE_PROVIDER_SITE_OTHER): Payer: Medicaid Other | Admitting: Obstetrics and Gynecology

## 2012-06-03 ENCOUNTER — Encounter: Payer: Self-pay | Admitting: Obstetrics and Gynecology

## 2012-06-03 VITALS — BP 120/80 | Wt 312.0 lb

## 2012-06-03 DIAGNOSIS — N898 Other specified noninflammatory disorders of vagina: Secondary | ICD-10-CM

## 2012-06-03 DIAGNOSIS — O26899 Other specified pregnancy related conditions, unspecified trimester: Secondary | ICD-10-CM

## 2012-06-03 LAB — POCT WET PREP (WET MOUNT)
Clue Cells Wet Prep Whiff POC: NEGATIVE
PH, VAGINAL: 4.5

## 2012-06-03 MED ORDER — TERCONAZOLE 0.4 % VA CREA: 1.0000 | TOPICAL_CREAM | Freq: Every day | VAGINAL | Status: DC

## 2012-06-03 NOTE — Progress Notes (Signed)
Pt c/o cuts and swelling in vaginal area for two days Pt states this occurred after taking keflex

## 2012-06-03 NOTE — Patient Instructions (Signed)
Monilial Vaginitis Vaginitis in a soreness, swelling and redness (inflammation) of the vagina and vulva. Monilial vaginitis is not a sexually transmitted infection. CAUSES  Yeast vaginitis is caused by yeast (candida) that is normally found in your vagina. With a yeast infection, the candida has overgrown in number to a point that upsets the chemical balance. SYMPTOMS   White, thick vaginal discharge.  Swelling, itching, redness and irritation of the vagina and possibly the lips of the vagina (vulva).  Burning or painful urination.  Painful intercourse. DIAGNOSIS  Things that may contribute to monilial vaginitis are:  Postmenopausal and virginal states.  Pregnancy.  Infections.  Being tired, sick or stressed, especially if you had monilial vaginitis in the past.  Diabetes. Good control will help lower the chance.  Birth control pills.  Tight fitting garments.  Using bubble bath, feminine sprays, douches or deodorant tampons.  Taking certain medications that kill germs (antibiotics).  Sporadic recurrence can occur if you become ill. TREATMENT  Your caregiver will give you medication.  There are several kinds of anti monilial vaginal creams and suppositories specific for monilial vaginitis. For recurrent yeast infections, use a suppository or cream in the vagina 2 times a week, or as directed.  Anti-monilial or steroid cream for the itching or irritation of the vulva may also be used. Get your caregiver's permission.  Painting the vagina with methylene blue solution may help if the monilial cream does not work.  Eating yogurt may help prevent monilial vaginitis. HOME CARE INSTRUCTIONS   Finish all medication as prescribed.  Do not have sex until treatment is completed or after your caregiver tells you it is okay.  Take warm sitz baths.  Do not douche.  Do not use tampons, especially scented ones.  Wear cotton underwear.  Avoid tight pants and panty  hose.  Tell your sexual partner that you have a yeast infection. They should go to their caregiver if they have symptoms such as mild rash or itching.  Your sexual partner should be treated as well if your infection is difficult to eliminate.  Practice safer sex. Use condoms.  Some vaginal medications cause latex condoms to fail. Vaginal medications that harm condoms are:  Cleocin cream.  Butoconazole (Femstat).  Terconazole (Terazol) vaginal suppository.  Miconazole (Monistat) (may be purchased over the counter). SEEK MEDICAL CARE IF:   You have a temperature by mouth above 102 F (38.9 C).  The infection is getting worse after 2 days of treatment.  The infection is not getting better after 3 days of treatment.  You develop blisters in or around your vagina.  You develop vaginal bleeding, and it is not your menstrual period.  You have pain when you urinate.  You develop intestinal problems.  You have pain with sexual intercourse. Document Released: 03/27/2005 Document Revised: 09/09/2011 Document Reviewed: 12/09/2008 Exit[employer] Patient Information 2013 [employer].  

## 2012-06-03 NOTE — Progress Notes (Signed)
[redacted]w[redacted]d Physical Examination: Pelvic - VULVA: normal appearing vulva with no masses, tenderness or lesions, vulvar erythema on both labia, VAGINA: vaginal erythema that is diffuse, CERVIX: normal appearing cervix without discharge or lesions, WET MOUNT done - results: hyphae, monilia, vaginal pH is 4.5, Yeast vaginits terazol rx

## 2012-06-18 ENCOUNTER — Encounter: Payer: Self-pay | Admitting: Obstetrics and Gynecology

## 2012-06-18 ENCOUNTER — Ambulatory Visit (INDEPENDENT_AMBULATORY_CARE_PROVIDER_SITE_OTHER): Payer: Medicaid Other | Admitting: Obstetrics and Gynecology

## 2012-06-18 VITALS — BP 110/68 | Wt 314.0 lb

## 2012-06-18 DIAGNOSIS — Z331 Pregnant state, incidental: Secondary | ICD-10-CM

## 2012-06-18 NOTE — Progress Notes (Signed)
[redacted]w[redacted]d Pt without c/o FKC reviewed US@[facility]  for growth pt with marginal CI

## 2012-06-18 NOTE — Patient Instructions (Signed)
Fetal Movement Counts Patient Name: __________________________________________________ Patient Due Date: ____________________ Kick counts is highly recommended in high risk pregnancies, but it is a good idea for every pregnant woman to do. Start counting fetal movements at 28 weeks of the pregnancy. Fetal movements increase after eating a full meal or eating or drinking something sweet (the blood sugar is higher). It is also important to drink plenty of fluids (well hydrated) before doing the count. Lie on your left side because it helps with the circulation or you can sit in a comfortable chair with your arms over your belly (abdomen) with no distractions around you. DOING THE COUNT  Try to do the count the same time of day each time you do it.  Mark the day and time, then see how long it takes for you to feel 10 movements (kicks, flutters, swishes, rolls). You should have at least 10 movements within 2 hours. You will most likely feel 10 movements in much less than 2 hours. If you do not, wait an hour and count again. After a couple of days you will see a pattern.  What you are looking for is a change in the pattern or not enough counts in 2 hours. Is it taking longer in time to reach 10 movements? SEEK MEDICAL CARE IF:  You feel less than 10 counts in 2 hours. Tried twice.  No movement in one hour.  The pattern is changing or taking longer each day to reach 10 counts in 2 hours.  You feel the baby is not moving as it usually does. Date: ____________ Movements: ____________ Start time: ____________ Finish time: ____________  Date: ____________ Movements: ____________ Start time: ____________ Finish time: ____________ Date: ____________ Movements: ____________ Start time: ____________ Finish time: ____________ Date: ____________ Movements: ____________ Start time: ____________ Finish time: ____________ Date: ____________ Movements: ____________ Start time: ____________ Finish time:  ____________ Date: ____________ Movements: ____________ Start time: ____________ Finish time: ____________ Date: ____________ Movements: ____________ Start time: ____________ Finish time: ____________ Date: ____________ Movements: ____________ Start time: ____________ Finish time: ____________  Date: ____________ Movements: ____________ Start time: ____________ Finish time: ____________ Date: ____________ Movements: ____________ Start time: ____________ Finish time: ____________ Date: ____________ Movements: ____________ Start time: ____________ Finish time: ____________ Date: ____________ Movements: ____________ Start time: ____________ Finish time: ____________ Date: ____________ Movements: ____________ Start time: ____________ Finish time: ____________ Date: ____________ Movements: ____________ Start time: ____________ Finish time: ____________ Date: ____________ Movements: ____________ Start time: ____________ Finish time: ____________  Date: ____________ Movements: ____________ Start time: ____________ Finish time: ____________ Date: ____________ Movements: ____________ Start time: ____________ Finish time: ____________ Date: ____________ Movements: ____________ Start time: ____________ Finish time: ____________ Date: ____________ Movements: ____________ Start time: ____________ Finish time: ____________ Date: ____________ Movements: ____________ Start time: ____________ Finish time: ____________ Date: ____________ Movements: ____________ Start time: ____________ Finish time: ____________ Date: ____________ Movements: ____________ Start time: ____________ Finish time: ____________  Date: ____________ Movements: ____________ Start time: ____________ Finish time: ____________ Date: ____________ Movements: ____________ Start time: ____________ Finish time: ____________ Date: ____________ Movements: ____________ Start time: ____________ Finish time: ____________ Date: ____________ Movements:  ____________ Start time: ____________ Finish time: ____________ Date: ____________ Movements: ____________ Start time: ____________ Finish time: ____________ Date: ____________ Movements: ____________ Start time: ____________ Finish time: ____________ Date: ____________ Movements: ____________ Start time: ____________ Finish time: ____________  Date: ____________ Movements: ____________ Start time: ____________ Finish time: ____________ Date: ____________ Movements: ____________ Start time: ____________ Finish time: ____________ Date: ____________ Movements: ____________ Start time: ____________ Finish time: ____________ Date: ____________ Movements:   ____________ Start time: ____________ Finish time: ____________ Date: ____________ Movements: ____________ Start time: ____________ Finish time: ____________ Date: ____________ Movements: ____________ Start time: ____________ Finish time: ____________ Date: ____________ Movements: ____________ Start time: ____________ Finish time: ____________  Date: ____________ Movements: ____________ Start time: ____________ Finish time: ____________ Date: ____________ Movements: ____________ Start time: ____________ Finish time: ____________ Date: ____________ Movements: ____________ Start time: ____________ Finish time: ____________ Date: ____________ Movements: ____________ Start time: ____________ Finish time: ____________ Date: ____________ Movements: ____________ Start time: ____________ Finish time: ____________ Date: ____________ Movements: ____________ Start time: ____________ Finish time: ____________ Date: ____________ Movements: ____________ Start time: ____________ Finish time: ____________  Date: ____________ Movements: ____________ Start time: ____________ Finish time: ____________ Date: ____________ Movements: ____________ Start time: ____________ Finish time: ____________ Date: ____________ Movements: ____________ Start time: ____________ Finish  time: ____________ Date: ____________ Movements: ____________ Start time: ____________ Finish time: ____________ Date: ____________ Movements: ____________ Start time: ____________ Finish time: ____________ Date: ____________ Movements: ____________ Start time: ____________ Finish time: ____________ Date: ____________ Movements: ____________ Start time: ____________ Finish time: ____________  Date: ____________ Movements: ____________ Start time: ____________ Finish time: ____________ Date: ____________ Movements: ____________ Start time: ____________ Finish time: ____________ Date: ____________ Movements: ____________ Start time: ____________ Finish time: ____________ Date: ____________ Movements: ____________ Start time: ____________ Finish time: ____________ Date: ____________ Movements: ____________ Start time: ____________ Finish time: ____________ Date: ____________ Movements: ____________ Start time: ____________ Finish time: ____________ Document Released: 07/17/2006 Document Revised: 09/09/2011 Document Reviewed: 01/17/2009 [employer] Patient Information 2013 [employer].  

## 2012-06-18 NOTE — Progress Notes (Signed)
Pt stated no issues today.  

## 2012-07-01 NOTE — L&D Delivery Note (Signed)
Delivery Note At 8:55 AM a viable female, "Marissa Saunders", was delivered via Vaginal, Spontaneous Delivery (Presentation: ; Occiput Anterior).  APGAR: 8, 9; weight 7 lb 13.6 oz (3560 g).   Placenta status: Intact, Spontaneous.  Cord: 3 vessels with the following complications: None.  Cord pH: NA  Anesthesia: Lidocaine for repair. Episiotomy: None Lacerations: 2nd degree;Perineal--Dr. Stefano Gaul in to repair, due to redundant vaginal tissue making visualization difficult for me. Repair of laceration occurred in the usual fashion. Suture Repair: 3.0 vicryl Est. Blood Loss (mL): 300  Mom to postpartum.  Baby to skin to skin.Marland Kitchen  Nigel Bridgeman 08/15/2012, 5:30 PM

## 2012-07-03 ENCOUNTER — Ambulatory Visit (INDEPENDENT_AMBULATORY_CARE_PROVIDER_SITE_OTHER): Payer: Medicaid Other | Admitting: Obstetrics and Gynecology

## 2012-07-03 ENCOUNTER — Ambulatory Visit (INDEPENDENT_AMBULATORY_CARE_PROVIDER_SITE_OTHER): Payer: Medicaid Other

## 2012-07-03 ENCOUNTER — Encounter: Payer: Self-pay | Admitting: Obstetrics and Gynecology

## 2012-07-03 VITALS — BP 130/72 | Wt 332.0 lb

## 2012-07-03 DIAGNOSIS — IMO0001 Reserved for inherently not codable concepts without codable children: Secondary | ICD-10-CM

## 2012-07-03 DIAGNOSIS — IMO0002 Reserved for concepts with insufficient information to code with codable children: Secondary | ICD-10-CM

## 2012-07-03 LAB — US OB FOLLOW UP

## 2012-07-03 NOTE — Progress Notes (Signed)
Doing well. US WNL--done for marginal cord insertion. Repeat in 4 weeks for growth and fluid.

## 2012-07-03 NOTE — Progress Notes (Signed)
[redacted]w[redacted]d Pt c/o was worked in for u/s today. U/S wasn't scheduled last visit.  Ultrasound shows:  [place] EDD: 08/13/12           AFI: 14.52           Cervical length: 3.63 cm           Placenta localization: posterior           Fetal presentation: vertex

## 2012-07-13 ENCOUNTER — Ambulatory Visit (INDEPENDENT_AMBULATORY_CARE_PROVIDER_SITE_OTHER): Payer: Medicaid Other | Admitting: Obstetrics and Gynecology

## 2012-07-13 ENCOUNTER — Encounter: Payer: Self-pay | Admitting: Obstetrics and Gynecology

## 2012-07-13 VITALS — BP 134/78 | Wt 333.0 lb

## 2012-07-13 DIAGNOSIS — Z331 Pregnant state, incidental: Secondary | ICD-10-CM

## 2012-07-13 NOTE — Progress Notes (Signed)
Pt w/o complaint today.  

## 2012-07-13 NOTE — Progress Notes (Signed)
[redacted]w[redacted]d A/P GBS positive Fetal kick counts reviewed Labor reviewed with pt All patients  questions answered [place] for growth with BPP hypercoiled cord and obesity

## 2012-07-13 NOTE — Patient Instructions (Signed)
Group B Streptococcus Infection During Pregnancy  Group B streptococcus (GBS) is a type of bacteria often found in healthy women. GBS usually does not cause any symptoms or harm to healthy adult women, but the bacteria can make a baby very sick if it is passed to the baby during childbirth. GBS is not a sexually transmitted disease (STD). GBS is different from Group A streptococcus, the bacteria that causes "strep throat."  CAUSES   GBS bacteria can be found in the intestinal, reproductive, and urinary tracts of women. It can also be found in the female genital tract, most often in the vagina and rectal areas.   SYMPTOMS   In pregnancy, GBS can be in the following places:   Genital tract without symptoms.   Rectum without symptoms.   Urine with or without symptoms (asymptomatic bacteriuria).   Urinary symptoms can include pain, frequency, urgency, and blood with urination (cystitis).  Pregnant women who are infected with GBS are at increased risk of:   Early (premature) labor and delivery.   Prolonged rupture of the membranes.   Infection in the following places:   Bladder.   Kidneys (pyelonephritis).   Bag of waters or placenta (chorioamnionitis).   Uterus (endometritis) after delivery.  Newborns who are infected with GBS can develop:   Lung infection (pneumonia).   Blood infection (septicemia).   Infection of the lining of the brain and spinal cord (meningitis).  DIAGNOSIS   Diagnosis of GBS infection is made by screening tests done when you are 35 to [redacted] weeks pregnant. The test (culture) is an easy swab of the vagina and rectum. A sample of your urine might also be checked for the bacteria. Talk with your caregiver about a plan for labor if your test shows that you carry the GBS bacteria.  TREATMENT    If results of the culture are positive, showing that GBS is present, you likely will receive treatment with antibiotic medicines during labor. This will help prevent GBS from being passed to your baby. The antibiotics work only if they are given during labor. If treatment is given earlier in pregnancy, the bacteria may regrow and be present during labor. Tell your caregiver if you are allergic to penicillin or other antibiotics. Antibiotics are also given if:    Infection of the membranes (amnionitis) is suspected.   Labor begins or there is rupture of the membranes before 37 weeks of pregnancy and there is a high risk of delivering the baby.   The mother has a past history of giving birth to an infant with GBS infection.   The culture status is unknown (culture not performed or result not available) and there is:   Fever during labor.   Preterm labor (less than 37 weeks of pregnancy).   Prolonged rupture of membranes (18 hours or more).  Treatment of the mother during labor is not recommended when:   A planned cesarean delivery is done and there is no labor or ruptured membranes. This is true even if the mother tested positive for GBS.   There is a negative culture for GBS screening during the pregnancy, regardless of the risk factors during labor.  The infant will receive antibiotics if the infant tested positive for GBS or has signs and symptoms that suggest GBS infection is present. It is not recommended to routinely give antibiotics to infants whose mothers received antibiotic treatment during labor.  HOME CARE INSTRUCTIONS    Take all antibiotics as prescribed by your caregiver.     Only take medicine as directed by your caregiver.   Continue with prenatal visits and care.   Return for follow-up appointments and cultures.   Follow your caregiver's instructions.  SEEK MEDICAL CARE IF:    You have pain with urination.   You have frequent urination.   You have blood in your urine.   SEEK IMMEDIATE MEDICAL CARE IF:   You have a fever.   You have pain in the back, side, or uterus.   You have chills.   You have abdominal swelling (distension) or pain.   You have labor pains (contractions) every 10 minutes or more often.   You are leaking fluid or bleeding from your vagina.   You have pelvic pressure that feels like your baby is pushing down.   You have a low, dull backache.   You have cramps that feel like your period.   You have abdominal cramps with or without diarrhea.   You have repeated vomiting and diarrhea.   You have trouble breathing.   You develop confusion.   You have stiffness of your body or neck.  MAKE SURE YOU:    Understand these instructions.   Will watch your condition.   Will get help right away if you are not doing well or get worse.  Document Released: 09/24/2007 Document Revised: 09/09/2011 Document Reviewed: 10/28/2010  [employer] Patient Information 2013 [employer].

## 2012-07-21 ENCOUNTER — Ambulatory Visit: Payer: Medicaid Other | Admitting: Obstetrics and Gynecology

## 2012-07-21 ENCOUNTER — Ambulatory Visit: Payer: Medicaid Other

## 2012-07-21 VITALS — BP 130/64 | Wt 333.0 lb

## 2012-07-21 DIAGNOSIS — IMO0002 Reserved for concepts with insufficient information to code with codable children: Secondary | ICD-10-CM

## 2012-07-21 DIAGNOSIS — Z331 Pregnant state, incidental: Secondary | ICD-10-CM

## 2012-07-21 DIAGNOSIS — O99210 Obesity complicating pregnancy, unspecified trimester: Secondary | ICD-10-CM

## 2012-07-21 DIAGNOSIS — Z3490 Encounter for supervision of normal pregnancy, unspecified, unspecified trimester: Secondary | ICD-10-CM

## 2012-07-21 LAB — US OB FOLLOW UP

## 2012-07-21 NOTE — Progress Notes (Signed)
[redacted]w[redacted]d No complaints today.

## 2012-07-21 NOTE — Progress Notes (Signed)
[redacted]w[redacted]d. Pt wants her cx checked today.

## 2012-07-22 NOTE — Progress Notes (Signed)
Doing well, but very ready! Cervix closed, long, vtx -2 Reviewed s/s of labor. [place] today:  EFW 6+4, 54%ile, AFI 16.6, 60%ile, vtx.

## 2012-07-28 ENCOUNTER — Ambulatory Visit: Payer: Medicaid Other | Admitting: Obstetrics and Gynecology

## 2012-07-28 VITALS — BP 134/74 | Wt 340.0 lb

## 2012-07-28 DIAGNOSIS — Z331 Pregnant state, incidental: Secondary | ICD-10-CM

## 2012-07-28 NOTE — Progress Notes (Signed)
[redacted]w[redacted]d  Pt has no concerns today. Pt declines cervix check today.

## 2012-07-28 NOTE — Progress Notes (Signed)
[redacted]w[redacted]d No complaints today GBS + by urine at [facility] Cultures collected today Vtx by [facility] Will RTC in 1 week [place] for growth and fluid at 39 - 40 weeks

## 2012-07-29 LAB — GC/CHLAMYDIA PROBE AMP
CT Probe RNA: NEGATIVE
GC Probe RNA: NEGATIVE

## 2012-08-05 ENCOUNTER — Ambulatory Visit: Payer: Medicaid Other | Admitting: Obstetrics and Gynecology

## 2012-08-05 VITALS — BP 120/70 | Wt 355.0 lb

## 2012-08-05 DIAGNOSIS — Z331 Pregnant state, incidental: Secondary | ICD-10-CM

## 2012-08-05 NOTE — Progress Notes (Signed)
[redacted]w[redacted]d:

## 2012-08-05 NOTE — Progress Notes (Signed)
[redacted]w[redacted]d Doing well. Return to office in 1 week. Dr. Stefano Gaul

## 2012-08-12 ENCOUNTER — Ambulatory Visit: Payer: Medicaid Other | Admitting: Obstetrics and Gynecology

## 2012-08-12 VITALS — BP 124/80 | Wt 361.0 lb

## 2012-08-12 DIAGNOSIS — Z331 Pregnant state, incidental: Secondary | ICD-10-CM

## 2012-08-12 NOTE — Progress Notes (Signed)
[redacted]w[redacted]d Doing well. Return to office in 1 week. Dr. Stefano Gaul

## 2012-08-12 NOTE — Progress Notes (Signed)
[redacted]w[redacted]d  Pt request cervix check today.

## 2012-08-15 ENCOUNTER — Inpatient Hospital Stay (HOSPITAL_COMMUNITY)
Admission: AD | Admit: 2012-08-15 | Discharge: 2012-08-17 | DRG: 775 | Disposition: A | Discharge status: Home / Self Care | Payer: Medicaid Other | Source: Ambulatory Visit | Attending: Obstetrics and Gynecology | Admitting: Obstetrics and Gynecology

## 2012-08-15 ENCOUNTER — Encounter (HOSPITAL_COMMUNITY): Payer: Self-pay | Admitting: *Deleted

## 2012-08-15 DIAGNOSIS — Z6838 Body mass index (BMI) 38.0-38.9, adult: Secondary | ICD-10-CM

## 2012-08-15 DIAGNOSIS — R030 Elevated blood-pressure reading, without diagnosis of hypertension: Secondary | ICD-10-CM | POA: Diagnosis not present

## 2012-08-15 DIAGNOSIS — O99892 Other specified diseases and conditions complicating childbirth: Secondary | ICD-10-CM | POA: Diagnosis not present

## 2012-08-15 DIAGNOSIS — R87612 Low grade squamous intraepithelial lesion on cytologic smear of cervix (LGSIL): Secondary | ICD-10-CM

## 2012-08-15 DIAGNOSIS — IMO0002 Reserved for concepts with insufficient information to code with codable children: Secondary | ICD-10-CM

## 2012-08-15 DIAGNOSIS — O0930 Supervision of pregnancy with insufficient antenatal care, unspecified trimester: Secondary | ICD-10-CM

## 2012-08-15 DIAGNOSIS — Z22330 Carrier of Group B streptococcus: Secondary | ICD-10-CM

## 2012-08-15 LAB — COMPREHENSIVE METABOLIC PANEL
ALT: 11 U/L (ref 0–35)
AST: 15 U/L (ref 0–37)
Albumin: 2.4 g/dL — ABNORMAL LOW (ref 3.5–5.2)
Alkaline Phosphatase: 164 U/L — ABNORMAL HIGH (ref 39–117)
BUN: 13 mg/dL (ref 6–23)
CO2: 21 mEq/L (ref 19–32)
Calcium: 8.9 mg/dL (ref 8.4–10.5)
Chloride: 104 mEq/L (ref 96–112)
Creatinine, Ser: 0.61 mg/dL (ref 0.50–1.10)
GFR calc Af Amer: >90 mL/min (ref >90)
GFR calc non Af Amer: >90 mL/min (ref >90)
Glucose, Bld: 94 mg/dL (ref 70–99)
Potassium: 4.3 mEq/L (ref 3.5–5.1)
Sodium: 137 mEq/L (ref 135–145)
Total Bilirubin: 0.2 mg/dL — ABNORMAL LOW (ref 0.3–1.2)
Total Protein: 5.6 g/dL — ABNORMAL LOW (ref 6.0–8.3)

## 2012-08-15 LAB — CBC
HCT: 34 % — ABNORMAL LOW (ref 36.0–46.0)
Hemoglobin: 11.2 g/dL — ABNORMAL LOW (ref 12.0–15.0)
MCH: 28.2 pg (ref 26.0–34.0)
MCHC: 32.9 g/dL (ref 30.0–36.0)
MCV: 85.6 fL (ref 78.0–100.0)
Platelets: 209 10*3/uL (ref 150–400)
RBC: 3.97 MIL/uL (ref 3.87–5.11)
RDW: 14.3 % (ref 11.5–15.5)
WBC: 13.2 10*3/uL — ABNORMAL HIGH (ref 4.0–10.5)

## 2012-08-15 LAB — TYPE AND SCREEN
ABO/RH(D): A POS
Antibody Screen: NEGATIVE

## 2012-08-15 LAB — LACTATE DEHYDROGENASE: LDH: 231 U/L (ref 94–250)

## 2012-08-15 LAB — ABO/RH: ABO/RH(D): A POS

## 2012-08-15 LAB — URIC ACID: Uric Acid, Serum: 6.2 mg/dL (ref 2.4–7.0)

## 2012-08-15 LAB — RPR: RPR Ser Ql: NONREACTIVE

## 2012-08-15 MED ORDER — OXYTOCIN BOLUS FROM INFUSION: 500.0000 mL | INTRAVENOUS | Status: DC

## 2012-08-15 MED ORDER — IBUPROFEN 600 MG PO TABS
600.0000 mg | ORAL_TABLET | Freq: Four times a day (QID) | ORAL | Status: DC
  Administered 2012-08-15 – 2012-08-17 (×7): 600 mg via ORAL
  Filled 2012-08-15 (×7): qty 1

## 2012-08-15 MED ORDER — LACTATED RINGERS IV SOLN: 500.0000 mL | INTRAVENOUS | Status: DC | PRN

## 2012-08-15 MED ORDER — LANOLIN HYDROUS EX OINT: TOPICAL_OINTMENT | CUTANEOUS | Status: DC | PRN

## 2012-08-15 MED ORDER — PRENATAL MULTIVITAMIN CH
1.0000 | ORAL_TABLET | Freq: Every day | ORAL | Status: DC
  Administered 2012-08-16 – 2012-08-17 (×2): 1 via ORAL
  Filled 2012-08-15 (×2): qty 1

## 2012-08-15 MED ORDER — PENICILLIN G POTASSIUM 5000000 UNITS IJ SOLR
2.5000 10*6.[IU] | INTRAMUSCULAR | Status: DC
  Filled 2012-08-15 (×2): qty 2.5

## 2012-08-15 MED ORDER — FENTANYL CITRATE 0.05 MG/ML IJ SOLN
100.0000 ug | INTRAMUSCULAR | Status: DC | PRN
  Administered 2012-08-15: 100 ug via INTRAVENOUS
  Filled 2012-08-15: qty 2

## 2012-08-15 MED ORDER — ONDANSETRON HCL 4 MG/2ML IJ SOLN: 4.0000 mg | Freq: Four times a day (QID) | INTRAMUSCULAR | Status: DC | PRN

## 2012-08-15 MED ORDER — OXYCODONE-ACETAMINOPHEN 5-325 MG PO TABS
1.0000 | ORAL_TABLET | ORAL | Status: DC | PRN
  Administered 2012-08-15 – 2012-08-16 (×3): 1 via ORAL
  Filled 2012-08-15 (×2): qty 1

## 2012-08-15 MED ORDER — LACTATED RINGERS IV SOLN
INTRAVENOUS | Status: DC
  Administered 2012-08-15: 08:00:00 via INTRAUTERINE

## 2012-08-15 MED ORDER — OXYCODONE-ACETAMINOPHEN 5-325 MG PO TABS: 1.0000 | ORAL_TABLET | ORAL | Status: DC | PRN

## 2012-08-15 MED ORDER — MEASLES, MUMPS & RUBELLA VAC ~~LOC~~ INJ: 0.5000 mL | INJECTION | Freq: Once | SUBCUTANEOUS | Status: DC

## 2012-08-15 MED ORDER — DIBUCAINE 1 % RE OINT: 1.0000 "application " | TOPICAL_OINTMENT | RECTAL | Status: DC | PRN

## 2012-08-15 MED ORDER — BUTORPHANOL TARTRATE 1 MG/ML IJ SOLN
INTRAMUSCULAR | Status: AC
  Filled 2012-08-15: qty 1

## 2012-08-15 MED ORDER — DIPHENHYDRAMINE HCL 25 MG PO CAPS: 25.0000 mg | ORAL_CAPSULE | Freq: Four times a day (QID) | ORAL | Status: DC | PRN

## 2012-08-15 MED ORDER — LACTATED RINGERS IV SOLN
INTRAVENOUS | Status: DC
  Administered 2012-08-15: 06:00:00 via INTRAVENOUS

## 2012-08-15 MED ORDER — WITCH HAZEL-GLYCERIN EX PADS: 1.0000 "application " | MEDICATED_PAD | CUTANEOUS | Status: DC | PRN

## 2012-08-15 MED ORDER — SENNOSIDES-DOCUSATE SODIUM 8.6-50 MG PO TABS
2.0000 | ORAL_TABLET | Freq: Every day | ORAL | Status: DC
  Administered 2012-08-15 – 2012-08-16 (×2): 2 via ORAL

## 2012-08-15 MED ORDER — ACETAMINOPHEN 325 MG PO TABS: 650.0000 mg | ORAL_TABLET | ORAL | Status: DC | PRN

## 2012-08-15 MED ORDER — OXYTOCIN 10 UNIT/ML IJ SOLN
INTRAMUSCULAR | Status: AC
  Administered 2012-08-15: 10 [IU] via INTRAMUSCULAR
  Filled 2012-08-15: qty 2

## 2012-08-15 MED ORDER — LIDOCAINE HCL (PF) 1 % IJ SOLN: 30.0000 mL | Freq: Once | INTRAMUSCULAR | Status: DC

## 2012-08-15 MED ORDER — TETANUS-DIPHTH-ACELL PERTUSSIS 5-2.5-18.5 LF-MCG/0.5 IM SUSP: 0.5000 mL | Freq: Once | INTRAMUSCULAR | Status: DC

## 2012-08-15 MED ORDER — LIDOCAINE HCL (PF) 1 % IJ SOLN
30.0000 mL | INTRAMUSCULAR | Status: AC | PRN
  Administered 2012-08-15 (×2): 30 mL via SUBCUTANEOUS
  Filled 2012-08-15 (×2): qty 30

## 2012-08-15 MED ORDER — SIMETHICONE 80 MG PO CHEW: 80.0000 mg | CHEWABLE_TABLET | ORAL | Status: DC | PRN

## 2012-08-15 MED ORDER — MEDROXYPROGESTERONE ACETATE 150 MG/ML IM SUSP: 150.0000 mg | INTRAMUSCULAR | Status: DC | PRN

## 2012-08-15 MED ORDER — ZOLPIDEM TARTRATE 5 MG PO TABS: 5.0000 mg | ORAL_TABLET | Freq: Every evening | ORAL | Status: DC | PRN

## 2012-08-15 MED ORDER — OXYTOCIN 40 UNITS IN LACTATED RINGERS INFUSION - SIMPLE MED
62.5000 mL/h | INTRAVENOUS | Status: DC
  Filled 2012-08-15: qty 1000

## 2012-08-15 MED ORDER — BUTORPHANOL TARTRATE 1 MG/ML IJ SOLN
1.0000 mg | INTRAMUSCULAR | Status: DC | PRN
  Administered 2012-08-15: 1 mg via INTRAVENOUS

## 2012-08-15 MED ORDER — BENZOCAINE-MENTHOL 20-0.5 % EX AERO
1.0000 "application " | INHALATION_SPRAY | CUTANEOUS | Status: DC | PRN
  Filled 2012-08-15: qty 56

## 2012-08-15 MED ORDER — CITRIC ACID-SODIUM CITRATE 334-500 MG/5ML PO SOLN: 30.0000 mL | ORAL | Status: DC | PRN

## 2012-08-15 MED ORDER — ONDANSETRON HCL 4 MG/2ML IJ SOLN: 4.0000 mg | INTRAMUSCULAR | Status: DC | PRN

## 2012-08-15 MED ORDER — ONDANSETRON HCL 4 MG PO TABS: 4.0000 mg | ORAL_TABLET | ORAL | Status: DC | PRN

## 2012-08-15 MED ORDER — PENICILLIN G POTASSIUM 5000000 UNITS IJ SOLR
5.0000 10*6.[IU] | Freq: Once | INTRAVENOUS | Status: AC
  Administered 2012-08-15: 5 10*6.[IU] via INTRAVENOUS
  Filled 2012-08-15: qty 5

## 2012-08-15 MED ORDER — IBUPROFEN 600 MG PO TABS
600.0000 mg | ORAL_TABLET | Freq: Four times a day (QID) | ORAL | Status: DC | PRN
  Administered 2012-08-15: 600 mg via ORAL
  Filled 2012-08-15: qty 1

## 2012-08-15 NOTE — H&P (Signed)
Marissa Saunders is a 22 y.o. female presenting for labor check. Denies any VB, LOF, +FM.   Maternal Medical History:  Reason for admission: Contractions.   Contractions: Onset was 3-5 hours ago.   Frequency: regular.   Duration is approximately 60 seconds.   Perceived severity is moderate.    Fetal activity: Perceived fetal activity is normal.   Last perceived fetal movement was within the past hour.    Prenatal complications: no prenatal complications   OB History   Grav Para Term Preterm Abortions TAB SAB Ect Mult Living   1              Past Medical History  Diagnosis Date  . Anal bleeding 2012  . Rectal pain 2012  . Constipation 2012  . 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 Infection     Yeast;not freq, however seems more frequent since pregnancy  . Infection     UTI x 1  . 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 Anal fissure repair  05/02/11   Family History: family history includes Asthma in her mother; Diabetes in her mother; Hypertension in her father; and Liver disease in her maternal grandfather.  There is no history of Cancer. Social History:  reports that she has never smoked. She has never used smokeless tobacco. She reports that she does not drink alcohol or use illicit drugs.   Prenatal Transfer Tool  Maternal Diabetes: No Genetic Screening: Normal Maternal Ultrasounds/Referrals: Normal Fetal Ultrasounds or other Referrals:  None Maternal Substance Abuse:  No Significant Maternal Medications:  None Significant Maternal Lab Results:  Lab values include: Group B Strep positive Other Comments:  None  Review of Systems  All other systems reviewed and are negative.    Dilation: 6 Effacement (%): 90 Exam by:: DCALLAWAY, RN Blood pressure 186/102, pulse 71, temperature 98.1 F (36.7 C), temperature source Oral, resp. rate 20, height 5\' 5"  (1.651 m),  weight 365 lb (165.563 kg), last menstrual period 11/07/2011. Maternal Exam:  Uterine Assessment: Contraction strength is moderate.  Contraction duration is 60 seconds. Contraction frequency is regular.   Abdomen: Patient reports no abdominal tenderness. Fundal height is aga.   Estimated fetal weight is 7-8#.   Fetal presentation: vertex  Introitus: Normal vulva. Normal vagina.  Pelvis: adequate for delivery.   Cervix: Cervix evaluated by digital exam.     Fetal Exam Fetal Monitor Review: Mode: ultrasound.   Baseline rate: 130.  Variability: moderate (6-25 bpm).   Pattern: accelerations present and no decelerations.    Fetal State Assessment: Category I - tracings are normal.     Physical Exam  Nursing note and vitals reviewed. Constitutional: She is oriented to person, place, and time. She appears well-developed and well-nourished.  HENT:  Head: Normocephalic.  Eyes: Pupils are equal, round, and reactive to light.  Neck: Normal range of motion.  Cardiovascular: Normal rate, regular rhythm and normal heart sounds.   Respiratory: Effort normal and breath sounds normal.  GI: Soft. Bowel sounds are normal.  Genitourinary: Vagina normal.  Musculoskeletal: Normal range of motion.  Neurological: She is alert and oriented to person, place, and time. She has normal reflexes.  Skin: Skin is warm and dry.  Psychiatric: She has a normal mood and affect. Her behavior is normal.    Prenatal labs: ABO, Rh: A/POS/-- (09/17 1541) Antibody: NEG (09/17 1541) Rubella: 120.9 (09/17 1541) RPR:  NON REAC (11/22 1428)  HBsAg: NEGATIVE (09/17 1541)  HIV: NON REACTIVE (09/17 1541)  GBS: Positive (09/17 0000)   Assessment/Plan: IUP at [redacted]w[redacted]d GBS pos Active labor Elevated BP  Admit to b.s per c/w Dr Stefano Gaul Routine L&D orders PCN per protocol  Pain meds PRN Will check PIH labs, CTO BP closely,  Expectant management    Albeiro Trompeter M 08/15/2012, 5:54 AM

## 2012-08-15 NOTE — MAU Note (Signed)
PT SAYS STARTED HURTING   AT 0100.  VE IN OFFICE ON WED  3 CM. DENIES HSV AND MRSA.

## 2012-08-15 NOTE — Progress Notes (Addendum)
  Subjective: Requesting pain medication.   Objective: BP 155/82  Pulse 62  Temp(Src) 98.2 F (36.8 C) (Oral)  Resp 20  Ht 5\' 5"  (1.651 m)  Wt 365 lb (165.563 kg)  BMI 60.74 kg/m2  LMP 11/07/2011      Filed Vitals:   08/15/12 0532 08/15/12 0557 08/15/12 0604 08/15/12 0659  BP: 186/102   155/82  Pulse: 71   62  Temp:  98.2 F (36.8 C)    TempSrc:  Oral    Resp:  20    Height:   5\' 5"  (1.651 m)   Weight:   365 lb (165.563 kg)    Results for orders placed during the hospital encounter of 08/15/12 (from the past 24 hour(s))  COMPREHENSIVE METABOLIC PANEL     Status: Abnormal   Collection Time    08/15/12  6:10 AM      Result Value Range   Sodium 137  135 - 145 mEq/L   Potassium 4.3  3.5 - 5.1 mEq/L   Chloride 104  96 - 112 mEq/L   CO2 21  19 - 32 mEq/L   Glucose, Bld 94  70 - 99 mg/dL   BUN 13  6 - 23 mg/dL   Creatinine, Ser 1.61  0.50 - 1.10 mg/dL   Calcium 8.9  8.4 - 09.6 mg/dL   Total Protein 5.6 (*) 6.0 - 8.3 g/dL   Albumin 2.4 (*) 3.5 - 5.2 g/dL   AST 15  0 - 37 U/L   ALT 11  0 - 35 U/L   Alkaline Phosphatase 164 (*) 39 - 117 U/L   Total Bilirubin 0.2 (*) 0.3 - 1.2 mg/dL   GFR calc non Af Amer >90  >90 mL/min   GFR calc Af Amer >90  >90 mL/min  LACTATE DEHYDROGENASE     Status: None   Collection Time    08/15/12  6:10 AM      Result Value Range   LDH 231  94 - 250 U/L  URIC ACID     Status: None   Collection Time    08/15/12  6:10 AM      Result Value Range   Uric Acid, Serum 6.2  2.4 - 7.0 mg/dL  CBC     Status: Abnormal   Collection Time    08/15/12  6:10 AM      Result Value Range   WBC 13.2 (*) 4.0 - 10.5 K/uL   RBC 3.97  3.87 - 5.11 MIL/uL   Hemoglobin 11.2 (*) 12.0 - 15.0 g/dL   HCT 04.5 (*) 40.9 - 81.1 %   MCV 85.6  78.0 - 100.0 fL   MCH 28.2  26.0 - 34.0 pg   MCHC 32.9  30.0 - 36.0 g/dL   RDW 91.4  78.2 - 95.6 %   Platelets 209  150 - 400 K/uL     FHT: Variables vs early decels with UCs, baseline 130s, + scalp stimulation. UC:    regular, every 3 minutes, but difficulty to trace due to body habitus SVE:   Dilation: 7 Effacement (%): 100 Station: 0 Exam by:: Trentyn Boisclair cnm (Diann Bangerter cnm) AROM--clear fluid IUPC placed Appears to be more consistent with early decels.  Assessment / Plan: Progressive labor Will give pain medication Amnioinfusion  Marissa Saunders 08/15/2012, 7:42 AM

## 2012-08-15 NOTE — Progress Notes (Signed)
  Subjective: Just received Stadol, now feeling pressure.  Objective: BP 155/82  Pulse 62  Temp(Src) 98.2 F (36.8 C) (Oral)  Resp 20  Ht 5\' 5"  (1.651 m)  Wt 365 lb (165.563 kg)  BMI 60.74 kg/m2  LMP 11/07/2011      FHT:  Early decels and and mild variables UC:   regular, every 2 minutes SVE:   Dilation: 10 Effacement (%): 100 Station: 0;+1 Exam by:: Destini Cambre cnm  Assessment / Plan: Initiate active pushing.  Byrant Valent 08/15/2012, 8:14 AM

## 2012-08-16 LAB — CBC
HCT: 29.1 % — ABNORMAL LOW (ref 36.0–46.0)
Hemoglobin: 9.6 g/dL — ABNORMAL LOW (ref 12.0–15.0)
MCH: 28.7 pg (ref 26.0–34.0)
MCHC: 33 g/dL (ref 30.0–36.0)
MCV: 86.9 fL (ref 78.0–100.0)
Platelets: 187 10*3/uL (ref 150–400)
RBC: 3.35 MIL/uL — ABNORMAL LOW (ref 3.87–5.11)
RDW: 14.9 % (ref 11.5–15.5)
WBC: 13.3 10*3/uL — ABNORMAL HIGH (ref 4.0–10.5)

## 2012-08-16 LAB — URINALYSIS, DIPSTICK ONLY
Bilirubin Urine: NEGATIVE
Glucose, UA: NEGATIVE mg/dL
Hgb urine dipstick: NEGATIVE
Ketones, ur: NEGATIVE mg/dL
Leukocytes, UA: NEGATIVE
Nitrite: NEGATIVE
Protein, ur: NEGATIVE mg/dL
Specific Gravity, Urine: >1.03 — ABNORMAL HIGH (ref 1.005–1.030)
Urobilinogen, UA: 0.2 mg/dL (ref 0.0–1.0)
pH: 6 (ref 5.0–8.0)

## 2012-08-16 NOTE — Progress Notes (Signed)
Subjective:  The patient denies headaches, blurred vision, and right upper quadrant tenderness.  Objective:  BP 132/83  Pulse 101  Temp(Src) 98.1 F (36.7 C) (Oral)  Resp 20  Ht 5\' 5"  (1.651 m)  Wt 365 lb (165.563 kg)  BMI 60.74 kg/m2  SpO2 96%  LMP 11/07/2011  Abdomen: Nontender  Reflexes: 2/6. No clonus.  Assessment:  Elevated blood pressures   Plan:  Elevated blood pressures and preeclampsia discussed. PIH labs were normal earlier.  We will obtain a catheter urine sample for protein evaluation.  Marissa Saunders.D.

## 2012-08-16 NOTE — Progress Notes (Signed)
Post Partum Day 1:S/P SVB by VL, with 2nd degree repaired by Dr. Stefano Gaul. Subjective: Patient up ad lib, denies syncope or dizziness. Feeding:  Breast Contraceptive plan:   Undecided at present  Objective: Blood pressure 132/83, pulse 101, temperature 98.1 F (36.7 C), temperature source Oral, resp. rate 20, height 5\' 5"  (1.651 m), weight 365 lb (165.563 kg), last menstrual period 11/07/2011, SpO2 96.00%, unknown if currently breastfeeding.  Filed Vitals:   08/15/12 1600 08/15/12 1840 08/16/12 0001 08/16/12 0550  BP: 152/94 155/91 160/93 132/83  Pulse: 81 91 90 101  Temp: 98.2 F (36.8 C) 98.2 F (36.8 C) 98.4 F (36.9 C) 98.1 F (36.7 C)  TempSrc:  Oral  Oral  Resp: 18 18 18 20   Height:      Weight:      SpO2: 98% 96%    BP tends to be elevated when patient in pain.  Physical Exam:  General: alert Lochia: appropriate Uterine Fundus: firm Incision: healing well DVT Evaluation: No evidence of DVT seen on physical exam. Negative Homan's sign.   Recent Labs  08/15/12 0610 08/16/12 0500  HGB 11.2* 9.6*  HCT 34.0* 29.1*    Assessment/Plan: S/P Vaginal delivery day 1 Mild elevations of BP--normal PIH labs on admission BP parameters established for notification of provider Continue current care Plan for discharge tomorrow Reviewed contraceptive options--plans abstinence at present.   LOS: 1 day   Symia Herdt, Chip Boer 08/16/2012, 9:35 AM

## 2012-08-17 MED ORDER — IBUPROFEN 600 MG PO TABS: 600.0000 mg | ORAL_TABLET | Freq: Four times a day (QID) | ORAL | Status: DC | PRN

## 2012-08-17 NOTE — Discharge Summary (Signed)
Obstetric Discharge Summary Reason for Admission: onset of labor Prenatal Procedures: ultrasound Intrapartum Procedures: spontaneous vaginal delivery and GBS prophylaxis Postpartum Procedures: cath u/a Complications-Operative and Postpartum: 2nd degree perineal laceration Hemoglobin  Date Value Range Status  08/16/2012 9.6* 12.0 - 15.0 g/dL Final     HCT  Date Value Range Status  08/16/2012 29.1* 36.0 - 46.0 % Final   .. Results for orders placed during the hospital encounter of 08/15/12 (from the past 72 hour(s))  COMPREHENSIVE METABOLIC PANEL     Status: Abnormal   Collection Time    08/15/12  6:10 AM      Result Value Range   Sodium 137  135 - 145 mEq/L   Potassium 4.3  3.5 - 5.1 mEq/L   Chloride 104  96 - 112 mEq/L   CO2 21  19 - 32 mEq/L   Glucose, Bld 94  70 - 99 mg/dL   BUN 13  6 - 23 mg/dL   Creatinine, Ser 0.98  0.50 - 1.10 mg/dL   Calcium 8.9  8.4 - 11.9 mg/dL   Total Protein 5.6 (*) 6.0 - 8.3 g/dL   Albumin 2.4 (*) 3.5 - 5.2 g/dL   AST 15  0 - 37 U/L   ALT 11  0 - 35 U/L   Alkaline Phosphatase 164 (*) 39 - 117 U/L   Total Bilirubin 0.2 (*) 0.3 - 1.2 mg/dL   GFR calc non Af Amer >90  >90 mL/min   GFR calc Af Amer >90  >90 mL/min   Comment:            The eGFR has been calculated     using the CKD EPI equation.     This calculation has not been     validated in all clinical     situations.     eGFR's persistently     <90 mL/min signify     possible Chronic Kidney Disease.  LACTATE DEHYDROGENASE     Status: None   Collection Time    08/15/12  6:10 AM      Result Value Range   LDH 231  94 - 250 U/L  URIC ACID     Status: None   Collection Time    08/15/12  6:10 AM      Result Value Range   Uric Acid, Serum 6.2  2.4 - 7.0 mg/dL  CBC     Status: Abnormal   Collection Time    08/15/12  6:10 AM      Result Value Range   WBC 13.2 (*) 4.0 - 10.5 K/uL   RBC 3.97  3.87 - 5.11 MIL/uL   Hemoglobin 11.2 (*) 12.0 - 15.0 g/dL   HCT 14.7 (*) 82.9 - 56.2 %   MCV  85.6  78.0 - 100.0 fL   MCH 28.2  26.0 - 34.0 pg   MCHC 32.9  30.0 - 36.0 g/dL   RDW 13.0  86.5 - 78.4 %   Platelets 209  150 - 400 K/uL  TYPE AND SCREEN     Status: None   Collection Time    08/15/12  6:10 AM      Result Value Range   ABO/RH(D) A POS     Antibody Screen NEG     Sample Expiration 08/18/2012    RPR     Status: None   Collection Time    08/15/12  6:10 AM      Result Value Range   RPR NON REACTIVE  NON REACTIVE  ABO/RH     Status: None   Collection Time    08/15/12  6:10 AM      Result Value Range   ABO/RH(D) A POS    CBC     Status: Abnormal   Collection Time    08/16/12  5:00 AM      Result Value Range   WBC 13.3 (*) 4.0 - 10.5 K/uL   RBC 3.35 (*) 3.87 - 5.11 MIL/uL   Hemoglobin 9.6 (*) 12.0 - 15.0 g/dL   HCT 16.1 (*) 09.6 - 04.5 %   MCV 86.9  78.0 - 100.0 fL   MCH 28.7  26.0 - 34.0 pg   MCHC 33.0  30.0 - 36.0 g/dL   RDW 40.9  81.1 - 91.4 %   Platelets 187  150 - 400 K/uL  URINALYSIS, DIPSTICK ONLY     Status: Abnormal   Collection Time    08/16/12  4:00 PM      Result Value Range   Specific Gravity, Urine >1.030 (*) 1.005 - 1.030   pH 6.0  5.0 - 8.0   Glucose, UA NEGATIVE  NEGATIVE mg/dL   Hgb urine dipstick NEGATIVE  NEGATIVE   Bilirubin Urine NEGATIVE  NEGATIVE   Ketones, ur NEGATIVE  NEGATIVE mg/dL   Protein, ur NEGATIVE  NEGATIVE mg/dL   Urobilinogen, UA 0.2  0.0 - 1.0 mg/dL   Nitrite NEGATIVE  NEGATIVE   Leukocytes, UA NEGATIVE  NEGATIVE   Physical Exam:  General: alert, cooperative, no distress and morbidly obese Lochia: appropriate Uterine Fundus: firm Incision: n/a DVT Evaluation: No evidence of DVT seen on physical exam. Negative Homan's sign. Calf/Ankle edema is present. Hospital Course: Pt presented in active labor early AM on 08/15/12 at [redacted]w[redacted]d and cx 6/90%.  BP elevated on arrival=186/102.  PIH labs WNL.  Pt's labor progressed well after admission.  AROM at 0729, and IUPC was inserted to better eval FHR decels.  Amnioinfusion  started just after internalized.  Cx complete at 0805, and SVD at 0855 by VL, CNM.  Pt did receive IV pain medication during labor.  Second degree laceration repaired by AVS secondary to habitus and difficulty of repair.  BP remained elevated the remainder of Day 0, but started to normalized after MN, going into PPD#1.  Cath u/a done on PPD#1 and neg for protein, and pt remained asymptomatic.  BP appeared to be higher when pt's pain was higher.  BP's today are 130s/60-80.  Pt is up ad lib.  Tol po and voiding w/o difficulty.  Lactating, and VB is stable.  Plans abstinence at present for [facility].  Smart start eval planned for later this week for BP check.  Discharge Diagnoses: Term Pregnancy-delivered and lactating; elevated BP on admission and Day O--stable at time of d/c and neg PIH w/u; morbidly obese; h/o abnl pap;   Discharge Information: Date: 08/17/2012 PPD#2 (Monday) Activity: pelvic rest Diet: routine Medications: PNV and Ibuprofen Condition: stable Instructions: refer to practice specific booklet and PIH precautions Discharge to: home Follow-up Information   Follow up with [facility]. Schedule an appointment as soon as possible for a visit in 6 weeks. (or call as needed with any questions or concerns)    Contact information:   [address] [place] [address] [phone]      Newborn Data: Live born female "Marissa Saunders" (delivery provider: Nigel Bridgeman, CNM) Birth Weight: 7 lb 13.6 oz (3560 g) APGAR: 8, 9  Home  with mother. No circumcision planned and lactating.  Giovany Cosby H 08/17/2012, 10:43 AM

## 2012-08-17 NOTE — Progress Notes (Signed)
UR chart review completed.  

## 2012-08-28 ENCOUNTER — Telehealth (HOSPITAL_COMMUNITY): Payer: Self-pay | Admitting: *Deleted

## 2012-08-28 NOTE — Telephone Encounter (Signed)
Resolve episode 

## 2014-05-02 ENCOUNTER — Encounter (HOSPITAL_COMMUNITY): Payer: Self-pay | Admitting: *Deleted

## 2014-07-01 HISTORY — PX: LAPAROSCOPIC GASTRIC SLEEVE RESECTION: SHX5895

## 2018-04-26 ENCOUNTER — Emergency Department (HOSPITAL_COMMUNITY): Payer: Managed Care, Other (non HMO)

## 2018-04-26 ENCOUNTER — Other Ambulatory Visit: Payer: Self-pay

## 2018-04-26 ENCOUNTER — Encounter (HOSPITAL_COMMUNITY): Payer: Self-pay | Admitting: Emergency Medicine

## 2018-04-26 ENCOUNTER — Emergency Department (HOSPITAL_COMMUNITY)
Admission: EM | Admit: 2018-04-26 | Discharge: 2018-04-27 | Disposition: A | Discharge status: Home / Self Care | Payer: Managed Care, Other (non HMO) | Attending: Emergency Medicine | Admitting: Emergency Medicine

## 2018-04-26 DIAGNOSIS — R110 Nausea: Secondary | ICD-10-CM | POA: Insufficient documentation

## 2018-04-26 DIAGNOSIS — M542 Cervicalgia: Secondary | ICD-10-CM | POA: Insufficient documentation

## 2018-04-26 DIAGNOSIS — R51 Headache: Secondary | ICD-10-CM | POA: Diagnosis present

## 2018-04-26 DIAGNOSIS — H748X1 Other specified disorders of right middle ear and mastoid: Secondary | ICD-10-CM | POA: Insufficient documentation

## 2018-04-26 LAB — CBC WITH DIFFERENTIAL/PLATELET
ABS IMMATURE GRANULOCYTES: 0.02 10*3/uL (ref 0.00–0.07)
Basophils Absolute: 0 10*3/uL (ref 0.0–0.1)
Basophils Relative: 1 %
Eosinophils Absolute: 0 10*3/uL (ref 0.0–0.5)
Eosinophils Relative: 1 %
HCT: 40.4 % (ref 36.0–46.0)
HEMOGLOBIN: 12.8 g/dL (ref 12.0–15.0)
Immature Granulocytes: 0 %
LYMPHS PCT: 22 %
Lymphs Abs: 1.8 10*3/uL (ref 0.7–4.0)
MCH: 29 pg (ref 26.0–34.0)
MCHC: 31.7 g/dL (ref 30.0–36.0)
MCV: 91.6 fL (ref 80.0–100.0)
MONO ABS: 0.9 10*3/uL (ref 0.1–1.0)
MONOS PCT: 11 %
NEUTROS ABS: 5.5 10*3/uL (ref 1.7–7.7)
Neutrophils Relative %: 65 %
Platelets: 250 10*3/uL (ref 150–400)
RBC: 4.41 MIL/uL (ref 3.87–5.11)
RDW: 13.7 % (ref 11.5–15.5)
WBC: 8.2 10*3/uL (ref 4.0–10.5)
nRBC: 0 % (ref 0.0–0.2)

## 2018-04-26 LAB — COMPREHENSIVE METABOLIC PANEL
ALT: 13 U/L (ref 0–44)
AST: 20 U/L (ref 15–41)
Albumin: 3.3 g/dL — ABNORMAL LOW (ref 3.5–5.0)
Alkaline Phosphatase: 69 U/L (ref 38–126)
Anion gap: 8 (ref 5–15)
BUN: 9 mg/dL (ref 6–20)
CO2: 24 mmol/L (ref 22–32)
CREATININE: 0.66 mg/dL (ref 0.44–1.00)
Calcium: 8.9 mg/dL (ref 8.9–10.3)
Chloride: 107 mmol/L (ref 98–111)
GFR calc Af Amer: >60 mL/min (ref >60)
GFR calc non Af Amer: >60 mL/min (ref >60)
GLUCOSE: 112 mg/dL — AB (ref 70–99)
Potassium: 3.5 mmol/L (ref 3.5–5.1)
SODIUM: 139 mmol/L (ref 135–145)
Total Bilirubin: 0.8 mg/dL (ref 0.3–1.2)
Total Protein: 6.6 g/dL (ref 6.5–8.1)

## 2018-04-26 LAB — SAMPLE TO BLOOD BANK

## 2018-04-26 LAB — LIPASE, BLOOD: Lipase: 26 U/L (ref 11–51)

## 2018-04-26 LAB — I-STAT BETA HCG BLOOD, ED (MC, WL, AP ONLY): I-stat hCG, quantitative: <5 m[IU]/mL (ref <5)

## 2018-04-26 MED ORDER — ONDANSETRON 4 MG PO TBDP
4.0000 mg | ORAL_TABLET | Freq: Once | ORAL | Status: AC
  Administered 2018-04-26: 4 mg via ORAL
  Filled 2018-04-26: qty 1

## 2018-04-26 MED ORDER — IOPAMIDOL (ISOVUE-370) INJECTION 76%
INTRAVENOUS | Status: AC
  Filled 2018-04-26: qty 50

## 2018-04-26 NOTE — ED Notes (Signed)
Medium c-collar placed on pt at triage by EMT.
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
## 2018-04-26 NOTE — ED Notes (Signed)
PA at triage in for MSE.
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
## 2018-04-27 ENCOUNTER — Emergency Department (HOSPITAL_COMMUNITY): Payer: Managed Care, Other (non HMO)

## 2018-04-27 MED ORDER — ONDANSETRON 4 MG PO TBDP: 4.0000 mg | ORAL_TABLET | Freq: Three times a day (TID) | ORAL | 0 refills | Status: DC | PRN

## 2018-04-27 MED ORDER — IOPAMIDOL (ISOVUE-370) INJECTION 76%
50.0000 mL | Freq: Once | INTRAVENOUS | Status: AC | PRN
  Administered 2018-04-27: 50 mL via INTRAVENOUS
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
## 2019-07-02 NOTE — L&D Delivery Note (Addendum)
OB/GYN Faculty Practice Delivery Note  Marissa Saunders is a 29 y.o. G2P1001 s/p vaginal delivery at [redacted]w[redacted]d. She was admitted for spontaneous onset of labor. Given precipitous nature of delivery, nursing called author to pt's room for delivery.  ROM: 0h 32m with clear fluid GBS Status: positive Maximum Maternal Temperature: 98.16F  Delivery Date/Time: 7619 on 03/18/20 Delivery: Called to room and patient was complete and pushing. Head delivered middle OA. Tight nuchal cord x2 present and delivered through given inability to reduce at the perineum. Shoulder and body delivered in usual fashion. Infant with spontaneous cry, placed on mother's abdomen, dried and stimulated. Cord clamped x 2 after 1-minute delay, and cut by FOB under my direct supervision. Cord blood drawn. Placenta delivered spontaneously with gentle cord traction. Fundus firm with massage and Pitocin. Labia, perineum, vagina, and cervix were inspected, notable for second degree laceration.   Placenta: intact, 3-vessel cord Complications: none Lacerations: 2nd degree perineal laceration (Dr. Tenny Craw arrived prior to repair) EBL: pending Analgesia: none  Infant: female  APGARs 9 & 9  weight pending  Lynnda Shields, MD OB/GYN Fellow, Faculty Practice    The patient presented to maternity admissions in labor.  Upon admission she was 4/80/-2 @ 127.  At 308 I was called and informed that the patient was completely dilated with a strong urge to push without an epidural.  I arrived at 322.  At this time the baby had precipitously delivered and was attended by the faculty practice fellow.  The placenta was spontaneously delivered.  At the time of arrival I assumed care and proceeded to repair a second-degree perineal laceration.  The perineum was infiltrated with 1% lidocaine.  3-0 Vicryl was used to repair the second-degree perineal laceration in the usual fashion.  Adequate visualization was somewhat limited due to the patient's habitus and  tissue redundancy.  All sponge needle counts were correct following the delivery.

## 2019-08-07 ENCOUNTER — Emergency Department (HOSPITAL_COMMUNITY): Payer: Managed Care, Other (non HMO)

## 2019-08-07 ENCOUNTER — Encounter (HOSPITAL_COMMUNITY): Payer: Self-pay | Admitting: Obstetrics and Gynecology

## 2019-08-07 ENCOUNTER — Emergency Department (HOSPITAL_COMMUNITY)
Admission: AD | Admit: 2019-08-07 | Discharge: 2019-08-07 | Disposition: A | Discharge status: Home / Self Care | Payer: Managed Care, Other (non HMO) | Attending: Obstetrics and Gynecology | Admitting: Obstetrics and Gynecology

## 2019-08-07 ENCOUNTER — Other Ambulatory Visit: Payer: Self-pay

## 2019-08-07 DIAGNOSIS — Z3A08 8 weeks gestation of pregnancy: Secondary | ICD-10-CM

## 2019-08-07 DIAGNOSIS — O468X1 Other antepartum hemorrhage, first trimester: Secondary | ICD-10-CM

## 2019-08-07 DIAGNOSIS — Z3A01 Less than 8 weeks gestation of pregnancy: Secondary | ICD-10-CM | POA: Diagnosis not present

## 2019-08-07 DIAGNOSIS — O418X10 Other specified disorders of amniotic fluid and membranes, first trimester, not applicable or unspecified: Secondary | ICD-10-CM | POA: Diagnosis not present

## 2019-08-07 DIAGNOSIS — Z79899 Other long term (current) drug therapy: Secondary | ICD-10-CM | POA: Diagnosis not present

## 2019-08-07 DIAGNOSIS — O209 Hemorrhage in early pregnancy, unspecified: Secondary | ICD-10-CM | POA: Insufficient documentation

## 2019-08-07 DIAGNOSIS — N939 Abnormal uterine and vaginal bleeding, unspecified: Secondary | ICD-10-CM | POA: Diagnosis not present

## 2019-08-07 LAB — CBC
HCT: 36.6 % (ref 36.0–46.0)
Hemoglobin: 12.1 g/dL (ref 12.0–15.0)
MCH: 28.9 pg (ref 26.0–34.0)
MCHC: 33.1 g/dL (ref 30.0–36.0)
MCV: 87.6 fL (ref 80.0–100.0)
Platelets: 218 10*3/uL (ref 150–400)
RBC: 4.18 MIL/uL (ref 3.87–5.11)
RDW: 13.7 % (ref 11.5–15.5)
WBC: 7.2 10*3/uL (ref 4.0–10.5)
nRBC: 0 % (ref 0.0–0.2)

## 2019-08-07 LAB — WET PREP, GENITAL
Sperm: NONE SEEN
Trich, Wet Prep: NONE SEEN
Yeast Wet Prep HPF POC: NONE SEEN

## 2019-08-07 LAB — COMPREHENSIVE METABOLIC PANEL
ALT: 14 U/L (ref 0–44)
AST: 14 U/L — ABNORMAL LOW (ref 15–41)
Albumin: 3.5 g/dL (ref 3.5–5.0)
Alkaline Phosphatase: 45 U/L (ref 38–126)
Anion gap: 10 (ref 5–15)
BUN: 13 mg/dL (ref 6–20)
CO2: 24 mmol/L (ref 22–32)
Calcium: 9 mg/dL (ref 8.9–10.3)
Chloride: 103 mmol/L (ref 98–111)
Creatinine, Ser: 0.65 mg/dL (ref 0.44–1.00)
GFR calc Af Amer: >60 mL/min (ref >60)
GFR calc non Af Amer: >60 mL/min (ref >60)
Glucose, Bld: 89 mg/dL (ref 70–99)
Potassium: 4.1 mmol/L (ref 3.5–5.1)
Sodium: 137 mmol/L (ref 135–145)
Total Bilirubin: 0.4 mg/dL (ref 0.3–1.2)
Total Protein: 6 g/dL — ABNORMAL LOW (ref 6.5–8.1)

## 2019-08-07 LAB — URINALYSIS, ROUTINE W REFLEX MICROSCOPIC
Bilirubin Urine: NEGATIVE
Glucose, UA: NEGATIVE mg/dL
Hgb urine dipstick: NEGATIVE
Ketones, ur: NEGATIVE mg/dL
Leukocytes,Ua: NEGATIVE
Nitrite: NEGATIVE
Protein, ur: NEGATIVE mg/dL
Specific Gravity, Urine: 1.019 (ref 1.005–1.030)
pH: 7 (ref 5.0–8.0)

## 2019-08-07 LAB — POCT PREGNANCY, URINE: Preg Test, Ur: POSITIVE — AB

## 2019-08-07 LAB — HCG, QUANTITATIVE, PREGNANCY: hCG, Beta Chain, Quant, S: 92338 m[IU]/mL — ABNORMAL HIGH (ref <5)

## 2019-08-07 NOTE — MAU Provider Note (Signed)
Patient Marissa Saunders is a 29 y.o. G2P1001  At [redacted]w[redacted]d here with complaints of vaginal bleeding. She denies abdominal pain,abnormal discharge, dysuria, NV or other symptoms. She is 8 weeks by certain LMP. She denies recent intercourse.  History     CSN: [id number]  Arrival date and time: 08/07/19 1304   First Provider Initiated Contact with Patient 08/07/19 1403      Chief Complaint  Patient presents with  . Vaginal Bleeding   Vaginal Bleeding The patient's primary symptoms include vaginal bleeding. This is a new problem. The current episode started yesterday. The problem occurs constantly. The problem has been unchanged. She is pregnant. Pertinent negatives include no abdominal pain, constipation, diarrhea, urgency or vomiting. The vaginal discharge was bloody. The vaginal bleeding is spotting. She has not been passing clots. She has been passing tissue (small clot/tissue).  She noticed some pink and red bleeding yesterday and again today. It was mild; she only had to wear a pantyliner. She just wanted to make sure that everything was ok.   OB History    Gravida  2   Para  1   Term  1   Preterm      AB      Living  1     SAB      TAB      Ectopic      Multiple      Live Births  1           Past Medical History:  Diagnosis Date  . Abnormal Pap smear, low grade squamous intraepithelial lesion (LGSIL) 03/25/2012  . Anal bleeding 2012  . 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 Constipation 2012  . Infection    Yeast;not freq, however seems more frequent since pregnancy  . Infection    UTI x 1  . Rectal pain 2012    Past Surgical History:  Procedure Laterality Date  . ANAL FISSURE REPAIR  05/02/11  . LAPAROSCOPIC GASTRIC SLEEVE RESECTION      Family History  Problem Relation Age of Onset  . Diabetes Mother   . Asthma Mother   . Hypertension Father   . Liver disease Maternal Grandfather   . Cancer Neg Hx     Social History   Tobacco Use  .  Smoking status: Never Smoker  . Smokeless tobacco: Never Used  Substance Use Topics  . Alcohol use: Not Currently  . Drug use: No    Allergies: No Known Allergies  Medications Prior to Admission  Medication Sig Dispense Refill Last Dose  . folic acid (FOLVITE) 1 MG tablet Take 1 mg by mouth daily.   08/07/2019 at Unknown time  . Prenatal Vit-Fe Fumarate-FA (MULTIVITAMIN-PRENATAL) 27-0.8 MG TABS tablet Take 1 tablet by mouth daily at 12 noon.   08/07/2019 at Unknown time  . ibuprofen (ADVIL,MOTRIN) 600 MG tablet Take 1 tablet (600 mg total) by mouth every 6 (six) hours as needed for pain. 30 tablet 2   . ondansetron (ZOFRAN ODT) 4 MG disintegrating tablet Take 1 tablet (4 mg total) by mouth every 8 (eight) hours as needed for nausea or vomiting. 20 tablet 0     Review of Systems  Constitutional: Negative.   HENT: Negative.   Respiratory: Negative.   Cardiovascular: Negative.   Gastrointestinal: Negative for abdominal pain, constipation, diarrhea and vomiting.  Genitourinary: Positive for vaginal bleeding. Negative for urgency.  Neurological: Negative.    Physical Exam   Blood pressure 119/64, pulse 64,  temperature 99.2 F (37.3 C), temperature source Oral, resp. rate 16, height 5\' 5"  (1.651 m), weight 111.6 kg, last menstrual period 06/12/2019, SpO2 100 %, unknown if currently breastfeeding.  Physical Exam  Constitutional: She appears well-developed.  HENT:  Head: Normocephalic.  Eyes: Pupils are equal, round, and reactive to light.  Respiratory: Effort normal.  GI: Soft.  Genitourinary:    Vagina normal.     Genitourinary Comments:  NEFG; no blood in the vagina. Vaginal walls were pink with no lesions; cervix is pink, no lesions, no CMT, suprapubic or adnexal tenderness.    Musculoskeletal:        General: Normal range of motion.     Cervical back: Normal range of motion.  Neurological: She is alert.  Skin: Skin is warm.    MAU Course  Procedures  MDM -Complete ectopic  work up done; US shows Crocker with cardiac activity.  -Blood type is A positive.  -wet prep; positive for clue no other symptoms so not treated.  -GC CT pending  Assessment and Plan   1. Subchorionic hemorrhage of placenta in first trimester, single or unspecified fetus   2. Vaginal bleeding    2. Suggest pelvic rest for 8 weeks; expect that she will have some bleeding due to presence of [facility]. Reviewed bleeding precautions and when to return to MAU.   3. Patient has NOB visit at [facility] with Dr. Rogue Bussing on Monday as a new patient.   Mervyn Skeeters Marissa Saunders 08/07/2019, 2:03 PM

## 2019-08-07 NOTE — Discharge Instructions (Signed)

## 2019-08-07 NOTE — ED Notes (Signed)
Report given to christina in MAU

## 2019-08-07 NOTE — MAU Note (Signed)
Marissa Saunders is a 29 y.o. at [redacted]w[redacted]d here in MAU reporting: yesterday had some pink spotting one time. Today the bleeding is redder and 1 clot. Did not see any more bleeding when using the bathroom in MAU. No pain. No recent IC.  LMP:  06/12/19  Onset of complaint: yesterday  Pain score: 0/10  Vitals:   08/07/19 1244 08/07/19 1333  BP: 127/68 119/64  Pulse: 68 64  Resp: 15 16  Temp: 98.3 F (36.8 C) 99.2 F (37.3 C)  SpO2: 100% 100%     Lab orders placed from triage: UA, UPT

## 2019-08-09 LAB — GC/CHLAMYDIA PROBE AMP (~~LOC~~) NOT AT ARMC
Chlamydia: NEGATIVE
Comment: NEGATIVE
Comment: NORMAL
Neisseria Gonorrhea: NEGATIVE
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
## 2020-02-22 DIAGNOSIS — Z3480 Encounter for supervision of other normal pregnancy, unspecified trimester: Secondary | ICD-10-CM | POA: Diagnosis not present

## 2020-03-01 DIAGNOSIS — Z419 Encounter for procedure for purposes other than remedying health state, unspecified: Secondary | ICD-10-CM | POA: Diagnosis not present

## 2020-03-18 ENCOUNTER — Encounter (HOSPITAL_COMMUNITY): Payer: Self-pay | Admitting: Obstetrics and Gynecology

## 2020-03-18 ENCOUNTER — Inpatient Hospital Stay (HOSPITAL_COMMUNITY)
Admission: AD | Admit: 2020-03-18 | Discharge: 2020-03-20 | DRG: 807 | Disposition: A | Discharge status: Home / Self Care | Payer: Medicaid Other | Attending: Obstetrics and Gynecology | Admitting: Obstetrics and Gynecology

## 2020-03-18 ENCOUNTER — Other Ambulatory Visit: Payer: Self-pay

## 2020-03-18 DIAGNOSIS — Z20822 Contact with and (suspected) exposure to covid-19: Secondary | ICD-10-CM | POA: Diagnosis present

## 2020-03-18 DIAGNOSIS — O9902 Anemia complicating childbirth: Secondary | ICD-10-CM | POA: Diagnosis present

## 2020-03-18 DIAGNOSIS — Z3A40 40 weeks gestation of pregnancy: Secondary | ICD-10-CM | POA: Diagnosis not present

## 2020-03-18 DIAGNOSIS — D509 Iron deficiency anemia, unspecified: Secondary | ICD-10-CM | POA: Diagnosis not present

## 2020-03-18 DIAGNOSIS — O99824 Streptococcus B carrier state complicating childbirth: Secondary | ICD-10-CM | POA: Diagnosis present

## 2020-03-18 DIAGNOSIS — O99214 Obesity complicating childbirth: Secondary | ICD-10-CM | POA: Diagnosis present

## 2020-03-18 DIAGNOSIS — O26893 Other specified pregnancy related conditions, third trimester: Secondary | ICD-10-CM | POA: Diagnosis not present

## 2020-03-18 LAB — CBC
HCT: 35.5 % — ABNORMAL LOW (ref 36.0–46.0)
HCT: 29.5 % — ABNORMAL LOW (ref 36.0–46.0)
Hemoglobin: 10.8 g/dL — ABNORMAL LOW (ref 12.0–15.0)
Hemoglobin: 9.3 g/dL — ABNORMAL LOW (ref 12.0–15.0)
MCH: 26.2 pg (ref 26.0–34.0)
MCH: 25.8 pg — ABNORMAL LOW (ref 26.0–34.0)
MCHC: 30.4 g/dL (ref 30.0–36.0)
MCHC: 31.5 g/dL (ref 30.0–36.0)
MCV: 86 fL (ref 80.0–100.0)
MCV: 81.9 fL (ref 80.0–100.0)
Platelets: 248 10*3/uL (ref 150–400)
Platelets: 209 10*3/uL (ref 150–400)
RBC: 4.13 MIL/uL (ref 3.87–5.11)
RBC: 3.6 MIL/uL — ABNORMAL LOW (ref 3.87–5.11)
RDW: 14.1 % (ref 11.5–15.5)
RDW: 13.8 % (ref 11.5–15.5)
WBC: 15.2 10*3/uL — ABNORMAL HIGH (ref 4.0–10.5)
WBC: 18.2 10*3/uL — ABNORMAL HIGH (ref 4.0–10.5)
nRBC: 0 % (ref 0.0–0.2)
nRBC: 0 % (ref 0.0–0.2)

## 2020-03-18 LAB — RPR: RPR Ser Ql: NONREACTIVE

## 2020-03-18 LAB — COMPREHENSIVE METABOLIC PANEL
ALT: 12 U/L (ref 0–44)
AST: 19 U/L (ref 15–41)
Albumin: 2.8 g/dL — ABNORMAL LOW (ref 3.5–5.0)
Alkaline Phosphatase: 129 U/L — ABNORMAL HIGH (ref 38–126)
Anion gap: 8 (ref 5–15)
BUN: 10 mg/dL (ref 6–20)
CO2: 23 mmol/L (ref 22–32)
Calcium: 8.8 mg/dL — ABNORMAL LOW (ref 8.9–10.3)
Chloride: 105 mmol/L (ref 98–111)
Creatinine, Ser: 0.58 mg/dL (ref 0.44–1.00)
GFR calc Af Amer: >60 mL/min (ref >60)
GFR calc non Af Amer: >60 mL/min (ref >60)
Glucose, Bld: 103 mg/dL — ABNORMAL HIGH (ref 70–99)
Potassium: 4.2 mmol/L (ref 3.5–5.1)
Sodium: 136 mmol/L (ref 135–145)
Total Bilirubin: 0.5 mg/dL (ref 0.3–1.2)
Total Protein: 6.2 g/dL — ABNORMAL LOW (ref 6.5–8.1)

## 2020-03-18 LAB — SARS CORONAVIRUS 2 BY RT PCR (HOSPITAL ORDER, PERFORMED IN ~~LOC~~ HOSPITAL LAB): SARS Coronavirus 2: NEGATIVE

## 2020-03-18 LAB — TYPE AND SCREEN
ABO/RH(D): A POS
Antibody Screen: NEGATIVE

## 2020-03-18 MED ORDER — DIPHENHYDRAMINE HCL 25 MG PO CAPS: 25.0000 mg | ORAL_CAPSULE | Freq: Four times a day (QID) | ORAL | Status: DC | PRN

## 2020-03-18 MED ORDER — FLEET ENEMA 7-19 GM/118ML RE ENEM: 1.0000 | ENEMA | RECTAL | Status: DC | PRN

## 2020-03-18 MED ORDER — PRENATAL MULTIVITAMIN CH
1.0000 | ORAL_TABLET | Freq: Every day | ORAL | Status: DC
  Administered 2020-03-18 – 2020-03-19 (×2): 1 via ORAL
  Filled 2020-03-18 (×2): qty 1

## 2020-03-18 MED ORDER — OXYCODONE-ACETAMINOPHEN 5-325 MG PO TABS: 2.0000 | ORAL_TABLET | ORAL | Status: DC | PRN

## 2020-03-18 MED ORDER — METHYLERGONOVINE MALEATE 0.2 MG/ML IJ SOLN: 0.2000 mg | INTRAMUSCULAR | Status: DC | PRN

## 2020-03-18 MED ORDER — PHENYLEPHRINE 40 MCG/ML (10ML) SYRINGE FOR IV PUSH (FOR BLOOD PRESSURE SUPPORT): 80.0000 ug | PREFILLED_SYRINGE | INTRAVENOUS | Status: DC | PRN

## 2020-03-18 MED ORDER — LACTATED RINGERS IV SOLN: 500.0000 mL | INTRAVENOUS | Status: DC | PRN

## 2020-03-18 MED ORDER — OXYCODONE HCL 5 MG PO TABS: 5.0000 mg | ORAL_TABLET | ORAL | Status: DC | PRN

## 2020-03-18 MED ORDER — ONDANSETRON HCL 4 MG/2ML IJ SOLN: 4.0000 mg | INTRAMUSCULAR | Status: DC | PRN

## 2020-03-18 MED ORDER — TETANUS-DIPHTH-ACELL PERTUSSIS 5-2.5-18.5 LF-MCG/0.5 IM SUSP: 0.5000 mL | Freq: Once | INTRAMUSCULAR | Status: DC

## 2020-03-18 MED ORDER — EPHEDRINE 5 MG/ML INJ: 10.0000 mg | INTRAVENOUS | Status: DC | PRN

## 2020-03-18 MED ORDER — LACTATED RINGERS IV SOLN: 500.0000 mL | Freq: Once | INTRAVENOUS | Status: DC

## 2020-03-18 MED ORDER — OXYCODONE HCL 5 MG PO TABS: 10.0000 mg | ORAL_TABLET | ORAL | Status: DC | PRN

## 2020-03-18 MED ORDER — LIDOCAINE HCL (PF) 1 % IJ SOLN
30.0000 mL | INTRAMUSCULAR | Status: AC | PRN
  Administered 2020-03-18: 30 mL via SUBCUTANEOUS
  Filled 2020-03-18: qty 30

## 2020-03-18 MED ORDER — ACETAMINOPHEN 325 MG PO TABS
650.0000 mg | ORAL_TABLET | ORAL | Status: DC | PRN
  Administered 2020-03-18: 650 mg via ORAL
  Filled 2020-03-18: qty 2

## 2020-03-18 MED ORDER — COCONUT OIL OIL: 1.0000 "application " | TOPICAL_OIL | Status: DC | PRN

## 2020-03-18 MED ORDER — DIPHENHYDRAMINE HCL 50 MG/ML IJ SOLN: 12.5000 mg | INTRAMUSCULAR | Status: DC | PRN

## 2020-03-18 MED ORDER — PENICILLIN G POT IN DEXTROSE 60000 UNIT/ML IV SOLN
3.0000 10*6.[IU] | INTRAVENOUS | Status: DC
  Filled 2020-03-18: qty 50

## 2020-03-18 MED ORDER — ONDANSETRON HCL 4 MG PO TABS: 4.0000 mg | ORAL_TABLET | ORAL | Status: DC | PRN

## 2020-03-18 MED ORDER — OXYTOCIN BOLUS FROM INFUSION
333.0000 mL | Freq: Once | INTRAVENOUS | Status: AC
  Administered 2020-03-18: 333 mL via INTRAVENOUS

## 2020-03-18 MED ORDER — SIMETHICONE 80 MG PO CHEW
80.0000 mg | CHEWABLE_TABLET | ORAL | Status: DC | PRN
  Administered 2020-03-19: 80 mg via ORAL
  Filled 2020-03-18: qty 1

## 2020-03-18 MED ORDER — SOD CITRATE-CITRIC ACID 500-334 MG/5ML PO SOLN: 30.0000 mL | ORAL | Status: DC | PRN

## 2020-03-18 MED ORDER — IBUPROFEN 600 MG PO TABS
600.0000 mg | ORAL_TABLET | Freq: Four times a day (QID) | ORAL | Status: DC
  Administered 2020-03-18 – 2020-03-20 (×8): 600 mg via ORAL
  Filled 2020-03-18 (×8): qty 1

## 2020-03-18 MED ORDER — METHYLERGONOVINE MALEATE 0.2 MG PO TABS: 0.2000 mg | ORAL_TABLET | ORAL | Status: DC | PRN

## 2020-03-18 MED ORDER — LACTATED RINGERS IV SOLN: INTRAVENOUS | Status: DC

## 2020-03-18 MED ORDER — OXYTOCIN-SODIUM CHLORIDE 30-0.9 UT/500ML-% IV SOLN
2.5000 [IU]/h | INTRAVENOUS | Status: DC
  Filled 2020-03-18: qty 500

## 2020-03-18 MED ORDER — SENNOSIDES-DOCUSATE SODIUM 8.6-50 MG PO TABS
2.0000 | ORAL_TABLET | ORAL | Status: DC
  Administered 2020-03-18: 2 via ORAL
  Filled 2020-03-18 (×2): qty 2

## 2020-03-18 MED ORDER — FENTANYL CITRATE (PF) 100 MCG/2ML IJ SOLN
100.0000 ug | INTRAMUSCULAR | Status: DC | PRN
  Administered 2020-03-18: 100 ug via INTRAVENOUS
  Filled 2020-03-18: qty 2

## 2020-03-18 MED ORDER — FENTANYL-BUPIVACAINE-NACL 0.5-0.125-0.9 MG/250ML-% EP SOLN: 12.0000 mL/h | EPIDURAL | Status: DC | PRN

## 2020-03-18 MED ORDER — BENZOCAINE-MENTHOL 20-0.5 % EX AERO
1.0000 "application " | INHALATION_SPRAY | CUTANEOUS | Status: DC | PRN
  Administered 2020-03-18: 1 via TOPICAL
  Filled 2020-03-18: qty 56

## 2020-03-18 MED ORDER — ACETAMINOPHEN 325 MG PO TABS: 650.0000 mg | ORAL_TABLET | ORAL | Status: DC | PRN

## 2020-03-18 MED ORDER — ZOLPIDEM TARTRATE 5 MG PO TABS: 5.0000 mg | ORAL_TABLET | Freq: Every evening | ORAL | Status: DC | PRN

## 2020-03-18 MED ORDER — WITCH HAZEL-GLYCERIN EX PADS: 1.0000 "application " | MEDICATED_PAD | CUTANEOUS | Status: DC | PRN

## 2020-03-18 MED ORDER — ONDANSETRON HCL 4 MG/2ML IJ SOLN: 4.0000 mg | Freq: Four times a day (QID) | INTRAMUSCULAR | Status: DC | PRN

## 2020-03-18 MED ORDER — DIBUCAINE (PERIANAL) 1 % EX OINT: 1.0000 "application " | TOPICAL_OINTMENT | CUTANEOUS | Status: DC | PRN

## 2020-03-18 MED ORDER — OXYCODONE-ACETAMINOPHEN 5-325 MG PO TABS: 1.0000 | ORAL_TABLET | ORAL | Status: DC | PRN

## 2020-03-18 MED ORDER — SODIUM CHLORIDE 0.9 % IV SOLN: 5.0000 10*6.[IU] | Freq: Once | INTRAVENOUS | Status: DC

## 2020-03-18 NOTE — H&P (Signed)
Marissa Saunders is a 29 y.o. female presenting for painful contractions  29 year old gravida 2 para 1-0-0-1 at 65+0 presents to maternity admissions complaining of painful contractions.  On presentation she was 4 cm 80% effaced and -2 station.  She is painfully contracting and admitted for labor.  Her pregnancy has been complicated by morbid obesity with initial BMI of 41.  Additionally, she has had iron deficiency anemia. OB History    Gravida  2   Para  1   Term  1   Preterm      AB      Living  1     SAB      TAB      Ectopic      Multiple      Live Births  1          Past Medical History:  Diagnosis Date  . Abnormal Pap smear, low grade squamous intraepithelial lesion (LGSIL) 03/25/2012  . Anal bleeding 2012  . 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 Constipation 2012  . Infection    Yeast;not freq, however seems more frequent since pregnancy  . Infection    UTI x 1  . Rectal pain 2012   Past Surgical History:  Procedure Laterality Date  . ANAL FISSURE REPAIR  05/02/11  . LAPAROSCOPIC GASTRIC SLEEVE RESECTION     Family History: family history includes Asthma in her mother; Diabetes in her mother; Hypertension in her father; Liver disease in her maternal grandfather. Social History:  reports that she has never smoked. She has never used smokeless tobacco. She reports previous alcohol use. She reports that she does not use drugs.     Maternal Diabetes: No Genetic Screening: Normal Maternal Ultrasounds/Referrals: Normal Fetal Ultrasounds or other Referrals:  None Maternal Substance Abuse:  No Significant Maternal Medications:  none Significant Maternal Lab Results:  Group B Strep positive Other Comments:  None  Review of Systems History Dilation: 10 Effacement (%): 100 Station: Plus 2 Exam by:: [place] RN Blood pressure (!) 147/81, pulse 66, temperature 98.3 F (36.8 C), temperature source Oral, resp. rate 16, height 5\' 5"  (1.651 m), weight  133.8 kg, last menstrual period 06/12/2019, SpO2 100 %, unknown if currently breastfeeding. Exam Physical Exam  Prenatal labs: ABO, Rh: --/--/A POS (09/18 0222) Antibody: NEG (09/18 0222) Rubella:  imm RPR:   NR HBsAg:   Neg HIV:   NR GBS:   positive  Assessment/Plan: 1) admit 2) epidural on request 3) on admission I was informed the patient was GBS negative, however in reviewing her office chart she is GBS positive.  Patient progressed quickly in labor and did not receive GBS prophylaxis.  02-23-1978 03/18/2020, 4:13 AM

## 2020-03-18 NOTE — MAU Note (Signed)
Pt reports contractions , denies bleeding or ROM. Reports good fetal movement

## 2020-03-19 NOTE — Progress Notes (Signed)
Post Partum Day 1 Subjective: no complaints, up ad lib, voiding and tolerating PO  Objective: Blood pressure 137/72, pulse 63, temperature 98.2 F (36.8 C), temperature source Oral, resp. rate 18, height 5\' 5"  (1.651 m), weight 133.8 kg, last menstrual period 06/12/2019, SpO2 100 %, unknown if currently breastfeeding.  Physical Exam:  General: alert, cooperative and appears stated age Lochia: appropriate Uterine Fundus: firm DVT Evaluation: No evidence of DVT seen on physical exam.  Recent Labs    03/18/20 0201 03/18/20 0615  HGB 10.8* 9.3*  HCT 35.5* 29.5*    Assessment/Plan: Plan for discharge tomorrow   LOS: 1 day   03/20/20 03/19/2020, 12:08 PM

## 2020-03-20 ENCOUNTER — Encounter (HOSPITAL_COMMUNITY): Payer: Self-pay | Admitting: *Deleted

## 2020-03-20 MED ORDER — IBUPROFEN 600 MG PO TABS
600.0000 mg | ORAL_TABLET | Freq: Four times a day (QID) | ORAL | 0 refills | Status: DC
Start: 2020-03-20 — End: 2020-06-06

## 2020-03-20 NOTE — Discharge Summary (Signed)
Postpartum Discharge Summary  Date of Service updated 03/20/20     Patient Name: Marissa Saunders DOB: [birth year] MRN: [id number]  Date of admission: 03/18/2020 Delivery date:03/18/2020  Delivering provider: Sheila Oats  Date of discharge: 03/20/2020  Admitting diagnosis: Normal labor [O80, Z37.9] Spontaneous vaginal delivery [O80] Intrauterine pregnancy: [redacted]w[redacted]d     Secondary diagnosis:  Active Problems:   Normal labor   Spontaneous vaginal delivery    Discharge diagnosis: Term Pregnancy Delivered                                              Post partum procedures:none Augmentation: N/A Complications: None  Hospital course: Onset of Labor With Vaginal Delivery      29 y.o. yo G2P1001 at [redacted]w[redacted]d was admitted in Active Labor on 03/18/2020. Patient had an uncomplicated labor course as follows:  Membrane Rupture Time/Date: 3:13 AM ,03/18/2020   Delivery Method:Vaginal, Spontaneous  Episiotomy: None  Lacerations:  2nd degree  Patient had an uncomplicated postpartum course.  She is ambulating, tolerating a regular diet, passing flatus, and urinating well. Patient is discharged home in stable condition on 03/20/20.  Newborn Data: Birth date:03/18/2020  Birth time:3:13 AM  Gender:Female  Living status:Living  Apgars:9 ,9  Weight:3442 g    Physical exam  Vitals:   03/19/20 0511 03/19/20 1444 03/19/20 2005 03/20/20 0608  BP: 137/72 119/72 (!) 141/58 133/75  Pulse: 63 95 80 70  Resp: 18 16 15 16   Temp: 98.2 F (36.8 C) (!) 97.5 F (36.4 C) 98.3 F (36.8 C) 97.7 F (36.5 C)  TempSrc: Oral Oral Oral Oral  SpO2: 100% 100% 97% 100%  Weight:      Height:       General: alert, cooperative and no distress Lochia: appropriate Uterine Fundus: firm  DVT Evaluation: No evidence of DVT seen on physical exam. Labs: Lab Results  Component Value Date   WBC 18.2 (H) 03/18/2020   HGB 9.3 (L) 03/18/2020   HCT 29.5 (L) 03/18/2020   MCV 81.9 03/18/2020   PLT 209 03/18/2020   CMP  Latest Ref Rng & Units 03/18/2020  Glucose 70 - 99 mg/dL 03/20/2020)  BUN 6 - 20 mg/dL 10  Creatinine 323(F - 5.73 mg/dL 2.20  Sodium 2.54 - 270 mmol/L 136  Potassium 3.5 - 5.1 mmol/L 4.2  Chloride 98 - 111 mmol/L 105  CO2 22 - 32 mmol/L 23  Calcium 8.9 - 10.3 mg/dL 623)  Total Protein 6.5 - 8.1 g/dL 6.2(L)  Total Bilirubin 0.3 - 1.2 mg/dL 0.5  Alkaline Phos 38 - 126 U/L 129(H)  AST 15 - 41 U/L 19  ALT 0 - 44 U/L 12   Edinburgh Score: Edinburgh Postnatal Depression Scale Screening Tool 03/18/2020  I have been able to laugh and see the funny side of things. 0  I have looked forward with enjoyment to things. 0  I have blamed myself unnecessarily when things went wrong. 2  I have been anxious or worried for no good reason. 2  I have felt scared or panicky for no good reason. 0  Things have been getting on top of me. 0  I have been so unhappy that I have had difficulty sleeping. 0  I have felt sad or miserable. 0  I have been so unhappy that I have been crying. 0  The thought of harming myself  has occurred to me. 0  Edinburgh Postnatal Depression Scale Total 4      After visit meds:  Allergies as of 03/20/2020   No Known Allergies     Medication List    STOP taking these medications   folic acid 1 MG tablet Commonly known as: FOLVITE     TAKE these medications   ibuprofen 600 MG tablet Commonly known as: ADVIL Take 1 tablet (600 mg total) by mouth every 6 (six) hours.   multivitamin-prenatal 27-0.8 MG Tabs tablet Take 1 tablet by mouth daily at 12 noon.        Discharge home in stable condition  Infant Disposition:home with mother Discharge instruction: per After Visit Summary and Postpartum booklet. Activity: Advance as tolerated. Pelvic rest for 6 weeks.  Diet: routine diet Postpartum Appointment:4 weeks  Future Appointments:No future appointments. Follow up Visit:  Follow-up Information    Waynard Reeds, MD. Schedule an appointment as soon as possible for a  visit in 4 week(s).   Specialty: Obstetrics and Gynecology Contact information: [address] [place] [address] [phone]                   03/20/2020 [facility] Lizabeth Leyden, MD

## 2020-03-24 ENCOUNTER — Inpatient Hospital Stay (HOSPITAL_COMMUNITY): Payer: Medicaid Other

## 2020-03-31 DIAGNOSIS — Z419 Encounter for procedure for purposes other than remedying health state, unspecified: Secondary | ICD-10-CM | POA: Diagnosis not present

## 2020-05-01 DIAGNOSIS — Z419 Encounter for procedure for purposes other than remedying health state, unspecified: Secondary | ICD-10-CM | POA: Diagnosis not present

## 2020-05-24 DIAGNOSIS — R87610 Atypical squamous cells of undetermined significance on cytologic smear of cervix (ASC-US): Secondary | ICD-10-CM | POA: Diagnosis not present

## 2020-05-24 DIAGNOSIS — R87810 Cervical high risk human papillomavirus (HPV) DNA test positive: Secondary | ICD-10-CM | POA: Diagnosis not present

## 2020-05-24 DIAGNOSIS — Z3202 Encounter for pregnancy test, result negative: Secondary | ICD-10-CM | POA: Diagnosis not present

## 2020-05-31 DIAGNOSIS — Z419 Encounter for procedure for purposes other than remedying health state, unspecified: Secondary | ICD-10-CM | POA: Diagnosis not present

## 2020-06-05 DIAGNOSIS — N871 Moderate cervical dysplasia: Secondary | ICD-10-CM | POA: Diagnosis not present

## 2020-06-06 ENCOUNTER — Encounter (HOSPITAL_BASED_OUTPATIENT_CLINIC_OR_DEPARTMENT_OTHER): Payer: Self-pay | Admitting: Obstetrics and Gynecology

## 2020-06-06 ENCOUNTER — Other Ambulatory Visit: Payer: Self-pay

## 2020-06-06 NOTE — Progress Notes (Signed)
Spoke w/ via phone for pre-op interview---pt Lab needs dos---urine preg-               Lab results------has lab appt 06-08-2020 at 1300 pm for t & S and anesthesia airway eval bmi 48.42 COVID test ------06-08-2020 1400 Arrive at -------1000 am 06-12-2020 NPO after MN NO Solid Food.  Clear liquids from MN until---900 am then npo Medications to take morning of surgery -----none Diabetic medication -----n/a Patient Special Instructions -----none Pre-Op special Istructions -----none Patient verbalized understanding of instructions that were given at this phone interview. Patient denies shortness of breath, chest pain, fever, cough at this phone interview.

## 2020-06-08 ENCOUNTER — Encounter (HOSPITAL_COMMUNITY)
Admission: RE | Admit: 2020-06-08 | Discharge: 2020-06-08 | Disposition: A | Discharge status: Home / Self Care | Payer: Medicaid Other | Source: Ambulatory Visit | Attending: Obstetrics and Gynecology | Admitting: Obstetrics and Gynecology

## 2020-06-08 ENCOUNTER — Other Ambulatory Visit (HOSPITAL_COMMUNITY)
Admission: RE | Admit: 2020-06-08 | Discharge: 2020-06-08 | Disposition: A | Discharge status: Home / Self Care | Payer: Medicaid Other | Source: Ambulatory Visit | Attending: Obstetrics and Gynecology | Admitting: Obstetrics and Gynecology

## 2020-06-08 ENCOUNTER — Other Ambulatory Visit: Payer: Self-pay

## 2020-06-08 ENCOUNTER — Other Ambulatory Visit (HOSPITAL_COMMUNITY): Payer: Medicaid Other

## 2020-06-08 DIAGNOSIS — Z01812 Encounter for preprocedural laboratory examination: Secondary | ICD-10-CM | POA: Diagnosis not present

## 2020-06-08 DIAGNOSIS — Z20822 Contact with and (suspected) exposure to covid-19: Secondary | ICD-10-CM | POA: Insufficient documentation

## 2020-06-09 LAB — SARS CORONAVIRUS 2 (TAT 6-24 HRS): SARS Coronavirus 2: NEGATIVE

## 2020-06-09 NOTE — Progress Notes (Addendum)
Pt had PAT appointment for T&S and  anesthesia consult yesterday 06-08-2020.  T&S will need to be dos due to had a baby within 3 months.  Ordered re-entered.  Per anesthesia, Jodell Cipro PA, pt ok to proceed.

## 2020-06-12 ENCOUNTER — Other Ambulatory Visit: Payer: Self-pay

## 2020-06-12 ENCOUNTER — Encounter (HOSPITAL_BASED_OUTPATIENT_CLINIC_OR_DEPARTMENT_OTHER): Payer: Self-pay | Admitting: Obstetrics and Gynecology

## 2020-06-12 ENCOUNTER — Ambulatory Visit (HOSPITAL_BASED_OUTPATIENT_CLINIC_OR_DEPARTMENT_OTHER): Payer: Medicaid Other | Admitting: Anesthesiology

## 2020-06-12 ENCOUNTER — Encounter (HOSPITAL_BASED_OUTPATIENT_CLINIC_OR_DEPARTMENT_OTHER)
Admission: RE | Disposition: A | Discharge status: Home / Self Care | Payer: Self-pay | Source: Home / Self Care | Attending: Obstetrics and Gynecology

## 2020-06-12 ENCOUNTER — Ambulatory Visit (HOSPITAL_BASED_OUTPATIENT_CLINIC_OR_DEPARTMENT_OTHER): Payer: Medicaid Other | Admitting: Physician Assistant

## 2020-06-12 ENCOUNTER — Ambulatory Visit (HOSPITAL_BASED_OUTPATIENT_CLINIC_OR_DEPARTMENT_OTHER)
Admission: RE | Admit: 2020-06-12 | Discharge: 2020-06-12 | Disposition: A | Discharge status: Home / Self Care | Payer: Medicaid Other | Attending: Obstetrics and Gynecology | Admitting: Obstetrics and Gynecology

## 2020-06-12 DIAGNOSIS — D069 Carcinoma in situ of cervix, unspecified: Secondary | ICD-10-CM | POA: Diagnosis not present

## 2020-06-12 DIAGNOSIS — N871 Moderate cervical dysplasia: Secondary | ICD-10-CM | POA: Diagnosis not present

## 2020-06-12 DIAGNOSIS — K219 Gastro-esophageal reflux disease without esophagitis: Secondary | ICD-10-CM | POA: Diagnosis not present

## 2020-06-12 HISTORY — PX: LEEP: SHX91

## 2020-06-12 HISTORY — DX: Gastro-esophageal reflux disease without esophagitis: K21.9

## 2020-06-12 LAB — TYPE AND SCREEN
ABO/RH(D): A POS
Antibody Screen: NEGATIVE

## 2020-06-12 LAB — POCT PREGNANCY, URINE: Preg Test, Ur: NEGATIVE

## 2020-06-12 SURGERY — LEEP (LOOP ELECTROSURGICAL EXCISION PROCEDURE)
Anesthesia: General | Site: Vagina

## 2020-06-12 MED ORDER — PROPOFOL 10 MG/ML IV BOLUS
INTRAVENOUS | Status: DC | PRN
  Administered 2020-06-12: 200 mg via INTRAVENOUS

## 2020-06-12 MED ORDER — POVIDONE-IODINE 10 % EX SWAB: 2.0000 "application " | Freq: Once | CUTANEOUS | Status: DC

## 2020-06-12 MED ORDER — KETOROLAC TROMETHAMINE 30 MG/ML IJ SOLN
INTRAMUSCULAR | Status: AC
  Filled 2020-06-12: qty 1

## 2020-06-12 MED ORDER — KETOROLAC TROMETHAMINE 30 MG/ML IJ SOLN
INTRAMUSCULAR | Status: DC | PRN
  Administered 2020-06-12: 30 mg via INTRAVENOUS

## 2020-06-12 MED ORDER — ACETIC ACID 4% SOLUTION
Status: DC | PRN
  Administered 2020-06-12: 1 via TOPICAL

## 2020-06-12 MED ORDER — ONDANSETRON HCL 4 MG/2ML IJ SOLN
INTRAMUSCULAR | Status: AC
  Filled 2020-06-12: qty 2

## 2020-06-12 MED ORDER — LIDOCAINE HCL (PF) 2 % IJ SOLN
INTRAMUSCULAR | Status: AC
  Filled 2020-06-12: qty 5

## 2020-06-12 MED ORDER — MIDAZOLAM HCL 5 MG/5ML IJ SOLN
INTRAMUSCULAR | Status: DC | PRN
  Administered 2020-06-12: 2 mg via INTRAVENOUS

## 2020-06-12 MED ORDER — OXYCODONE HCL 5 MG PO TABS: 5.0000 mg | ORAL_TABLET | Freq: Once | ORAL | Status: DC | PRN

## 2020-06-12 MED ORDER — DEXAMETHASONE SODIUM PHOSPHATE 10 MG/ML IJ SOLN
INTRAMUSCULAR | Status: AC
  Filled 2020-06-12: qty 1

## 2020-06-12 MED ORDER — GLYCOPYRROLATE PF 0.2 MG/ML IJ SOSY
PREFILLED_SYRINGE | INTRAMUSCULAR | Status: AC
  Filled 2020-06-12: qty 1

## 2020-06-12 MED ORDER — LACTATED RINGERS IV SOLN: INTRAVENOUS | Status: DC

## 2020-06-12 MED ORDER — DEXAMETHASONE SODIUM PHOSPHATE 10 MG/ML IJ SOLN
INTRAMUSCULAR | Status: DC | PRN
  Administered 2020-06-12: 10 mg via INTRAVENOUS

## 2020-06-12 MED ORDER — SCOPOLAMINE 1 MG/3DAYS TD PT72
MEDICATED_PATCH | TRANSDERMAL | Status: AC
  Filled 2020-06-12: qty 1

## 2020-06-12 MED ORDER — OXYCODONE HCL 5 MG/5ML PO SOLN: 5.0000 mg | Freq: Once | ORAL | Status: DC | PRN

## 2020-06-12 MED ORDER — AMISULPRIDE (ANTIEMETIC) 5 MG/2ML IV SOLN: 10.0000 mg | Freq: Once | INTRAVENOUS | Status: DC | PRN

## 2020-06-12 MED ORDER — LACTATED RINGERS IV SOLN
INTRAVENOUS | Status: DC
  Administered 2020-06-12: 10:00:00 1000 mL via INTRAVENOUS

## 2020-06-12 MED ORDER — GLYCOPYRROLATE PF 0.2 MG/ML IJ SOSY
PREFILLED_SYRINGE | INTRAMUSCULAR | Status: DC | PRN
  Administered 2020-06-12: .2 mg via INTRAVENOUS

## 2020-06-12 MED ORDER — LIDOCAINE 2% (20 MG/ML) 5 ML SYRINGE
INTRAMUSCULAR | Status: DC | PRN
  Administered 2020-06-12: 100 mg via INTRAVENOUS

## 2020-06-12 MED ORDER — SCOPOLAMINE 1 MG/3DAYS TD PT72
1.0000 | MEDICATED_PATCH | TRANSDERMAL | Status: DC
  Administered 2020-06-12: 11:00:00 1.5 mg via TRANSDERMAL

## 2020-06-12 MED ORDER — FENTANYL CITRATE (PF) 100 MCG/2ML IJ SOLN: 25.0000 ug | INTRAMUSCULAR | Status: DC | PRN

## 2020-06-12 MED ORDER — FENTANYL CITRATE (PF) 100 MCG/2ML IJ SOLN
INTRAMUSCULAR | Status: DC | PRN
  Administered 2020-06-12 (×4): 50 ug via INTRAVENOUS

## 2020-06-12 MED ORDER — PROPOFOL 10 MG/ML IV BOLUS
INTRAVENOUS | Status: AC
  Filled 2020-06-12: qty 20

## 2020-06-12 MED ORDER — LIDOCAINE-EPINEPHRINE (PF) 1 %-1:200000 IJ SOLN
INTRAMUSCULAR | Status: DC | PRN
  Administered 2020-06-12: 10 mL

## 2020-06-12 MED ORDER — FERRIC SUBSULFATE SOLN
Status: DC | PRN
  Administered 2020-06-12: 1

## 2020-06-12 MED ORDER — FENTANYL CITRATE (PF) 100 MCG/2ML IJ SOLN
INTRAMUSCULAR | Status: AC
  Filled 2020-06-12: qty 2

## 2020-06-12 MED ORDER — ONDANSETRON HCL 4 MG/2ML IJ SOLN
INTRAMUSCULAR | Status: DC | PRN
  Administered 2020-06-12: 4 mg via INTRAVENOUS

## 2020-06-12 MED ORDER — PROMETHAZINE HCL 25 MG/ML IJ SOLN: 6.2500 mg | INTRAMUSCULAR | Status: DC | PRN

## 2020-06-12 MED ORDER — MIDAZOLAM HCL 2 MG/2ML IJ SOLN
INTRAMUSCULAR | Status: AC
  Filled 2020-06-12: qty 2

## 2020-06-12 SURGICAL SUPPLY — 35 items
APL SWBSTK 6 STRL LF DISP (MISCELLANEOUS) ×1
APPLICATOR COTTON TIP 6 STRL (MISCELLANEOUS) ×1 IMPLANT
APPLICATOR COTTON TIP 6IN STRL (MISCELLANEOUS) ×3
ELECT BALL LEEP 5MM RED (ELECTRODE) IMPLANT
ELECT LLETZ BALL 5MM DISP (ELECTRODE) ×3 IMPLANT
ELECT LOOP LEEP RND 15X12 GRN (CUTTING LOOP)
ELECT LOOP LEEP RND 20X12 WHT (CUTTING LOOP) ×3
ELECT REM PT RETURN 9FT ADLT (ELECTROSURGICAL) ×3
ELECTRODE LOOP LP RND 15X12GRN (CUTTING LOOP) IMPLANT
ELECTRODE LOOP LP RND 20X12WHT (CUTTING LOOP) ×1 IMPLANT
ELECTRODE REM PT RTRN 9FT ADLT (ELECTROSURGICAL) ×1 IMPLANT
EXTENDER ELECT LOOP LEEP 10CM (CUTTING LOOP) IMPLANT
GLOVE BIO SURGEON STRL SZ7 (GLOVE) ×3 IMPLANT
GLOVE BIOGEL PI IND STRL 6.5 (GLOVE) ×1 IMPLANT
GLOVE BIOGEL PI IND STRL 7.0 (GLOVE) IMPLANT
GLOVE BIOGEL PI IND STRL 7.5 (GLOVE) ×1 IMPLANT
GLOVE BIOGEL PI INDICATOR 6.5 (GLOVE) ×2
GLOVE BIOGEL PI INDICATOR 7.0 (GLOVE)
GLOVE BIOGEL PI INDICATOR 7.5 (GLOVE) ×2
GLOVE ECLIPSE 7.0 STRL STRAW (GLOVE) ×3 IMPLANT
GLOVE SURG SS PI 7.0 STRL IVOR (GLOVE) ×3 IMPLANT
GOWN STRL REUS W/TWL LRG LVL3 (GOWN DISPOSABLE) ×6 IMPLANT
HIBICLENS CHG 4% 4OZ (MISCELLANEOUS) ×3 IMPLANT
KIT TURNOVER CYSTO (KITS) ×3 IMPLANT
MANIFOLD NEPTUNE II (INSTRUMENTS) ×3 IMPLANT
NS IRRIG 1000ML POUR BTL (IV SOLUTION) IMPLANT
PACK VAGINAL MINOR WOMEN LF (CUSTOM PROCEDURE TRAY) ×3 IMPLANT
PAD OB MATERNITY 4.3X12.25 (PERSONAL CARE ITEMS) ×3 IMPLANT
PENCIL SMOKE EVAC W/HOLSTER (ELECTROSURGICAL) ×3 IMPLANT
SCOPETTES 8  STERILE (MISCELLANEOUS) ×2
SCOPETTES 8 STERILE (MISCELLANEOUS) ×1 IMPLANT
SUT ETHILON 3 0 PS 1 (SUTURE) IMPLANT
SUT SILK 2 0 SH (SUTURE) ×3 IMPLANT
SYR CONTROL 10ML LL (SYRINGE) ×3 IMPLANT
TOWEL OR 17X26 10 PK STRL BLUE (TOWEL DISPOSABLE) ×6 IMPLANT

## 2020-06-12 NOTE — Discharge Instructions (Signed)
Loop Electrosurgical Excision Procedure What can I expect after the procedure? After the procedure, it is common to have:  Mild abdominal cramps that are similar to menstrual cramps. These may last for up to 1 week.  A small amount of pink-tinged or bloody vaginal discharge, including light to moderate bleeding, for 1-2 weeks.  A dark-colored discharge coming from your vagina. This is from the paste that was used on the cervix to prevent bleeding. It is up to you to get the results of your procedure. Ask your health care provider, or the department that is doing the procedure, when your results will be ready. Follow these instructions at home:  Take over-the-counter and prescription medicines only as told by your health care provider.  Return to your normal activities as told by your health care provider. Ask your health care provider what activities are safe for you.  Do not put anything in your vagina for 2 weeks after the procedure or until your health care provider says that it is okay. This includes tampons, creams, and douches.  Do not have sex until your health care provider approves.  Keep all follow-up visits as told by your health care provider. This is important. Contact a health care provider if you:  Have a fever or chills.  Feel unusually weak.  Have vaginal bleeding that is heavier or longer than a normal menstrual cycle. A sign of this can be soaking a pad with blood or bleeding with clots.  Develop a bad smelling vaginal discharge.  Have severe abdominal pain or cramping. Summary  Loop electrosurgical excision procedure (LEEP) is the removal of tissue from the cervix. The removed tissue will be checked for precancerous cells or cancer cells.  LEEP typically only takes a few minutes and is often done in the health care provider's office.  Do not put anything in your vagina for 2 weeks after the procedure or until your health care provider says that it is okay.  This includes tampons, creams, and douches.  Keep all follow-up visits as told by your health care provider. Ask your health care provider, or the department that is doing the procedure, when your results will be ready. This information is not intended to replace advice given to you by your health care provider. Make sure you discuss any questions you have with your health care provider. Document Revised: 07/10/2018 Document Reviewed: 07/10/2018 Elsevier Patient Education  2020 [employer].  Post Anesthesia Home Care Instructions  Activity: Get plenty of rest for the remainder of the day. A responsible individual must stay with you for 24 hours following the procedure.  For the next 24 hours, DO NOT: -Drive a car -Advertising copywriter -Drink alcoholic beverages -Take any medication unless instructed by your physician -Make any legal decisions or sign important papers.  Meals: Start with liquid foods such as gelatin or soup. Progress to regular foods as tolerated. Avoid greasy, spicy, heavy foods. If nausea and/or vomiting occur, drink only clear liquids until the nausea and/or vomiting subsides. Call your physician if vomiting continues.  Special Instructions/Symptoms: Your throat may feel dry or sore from the anesthesia or the breathing tube placed in your throat during surgery. If this causes discomfort, gargle with warm salt water. The discomfort should disappear within 24 hours.  If you had a scopolamine patch placed behind your ear for the management of post- operative nausea and/or vomiting:  1. The medication in the patch is effective for 72 hours, after which it should be  removed.  Wrap patch in a tissue and discard in the trash. Wash hands thoroughly with soap and water. 2. You may remove the patch earlier than 72 hours if you experience unpleasant side effects which may include dry mouth, dizziness or visual disturbances. 3. Avoid touching the patch. Wash your hands with soap and  water after contact with the patch.  No ibuprofen, Advil, Aleve, Motrin, or naproxen until after 8:30 pm today if needed.

## 2020-06-12 NOTE — H&P (Signed)
29 y.o. [id number] presents for schedule LEEP.  Pt reported history of LSIL 2013 at a prior office.  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 Discussed ASCCP guideline options and pt would like to proceed with LEEP rather than increased surveillance, does not desire future pregnancy  Past Medical History:  Diagnosis Date   Abnormal Pap smear, low grade squamous intraepithelial lesion (LGSIL) 03/25/2012   GERD (gastroesophageal reflux disease)    Past Surgical History:  Procedure Laterality Date   ANAL FISSURE REPAIR  05/02/11   LAPAROSCOPIC GASTRIC SLEEVE RESECTION  2016    Social History   Socioeconomic History   Marital status: Single    Spouse name: Not on file   Number of children: Not on file   Years of education: 12.5   Highest education level: Not on file  Occupational History   Occupation: Deli clerk/student    Employer: [employer]  Tobacco Use   Smoking status: Never Smoker   Smokeless tobacco: Never Used  Vaping Use   Vaping Use: Never used  Substance and Sexual Activity   Alcohol use: Yes    Comment: occ   Drug use: No   Sexual activity: Yes    Partners: Male    Birth control/protection: None  Other Topics Concern   Not on file  Social History Narrative   Caffienated drinks-no   Seat belt use often-yes   Regular Exercise-no   Smoke alarm in the home-yes   Firearms/guns in the home-no   History of physical abuse-no               Social Determinants of Health   Financial Resource Strain: Not on file  Food Insecurity: Not on file  Transportation Needs: Not on file  Physical Activity: Not on file  Stress: Not on file  Social Connections: Not on file  Intimate Partner Violence: Not on file    No current facility-administered medications on file  prior to encounter.   Current Outpatient Medications on File Prior to Encounter  Medication Sig Dispense Refill   calcium carbonate (TUMS - DOSED IN MG ELEMENTAL CALCIUM) 500 MG chewable tablet Chew 1 tablet by mouth as needed for indigestion or heartburn.      No Known Allergies  Vitals:   06/06/20 1016 06/12/20 1021  BP:  (!) 149/77  Pulse:  72  Resp:  18  Temp:  (!) 97.3 F (36.3 C)  TempSrc:  Oral  SpO2:  100%  Weight: 132 kg 133 kg  Height: 5\' 5"  (1.651 m) 5' 5.5" (1.664 m)   Per office eval: Lungs: clear to ascultation Cor:  RRR Abdomen:  soft, nontender, nondistended. Ex:  no cords, erythema Pelvic:  Deferred to OR  A:  Admit for scheduled LEEP, pt prefers procedure with anesthesia.   P: All risks, benefits and alternatives d/w patient and she desires to proceed with LEEP, aware of risks of cervical insufficiency in future pregnancy.  Pt states no further pregnancies are desired. UPT negative Routine pre-op care  09-05-1973

## 2020-06-12 NOTE — Transfer of Care (Signed)
Immediate Anesthesia Transfer of Care Note  Patient: Marissa Saunders  Procedure(s) Performed: LOOP ELECTROSURGICAL EXCISION PROCEDURE (LEEP) (N/A Vagina )  Patient Location: PACU  Anesthesia Type:General  Level of Consciousness: awake, alert , oriented and patient cooperative  Airway & Oxygen Therapy: Patient Spontanous Breathing  Post-op Assessment: Report given to RN and Post -op Vital signs reviewed and stable  Post vital signs: Reviewed and stable  Last Vitals:  Vitals Value Taken Time  BP    Temp 36.6 C 06/12/20 1254  Pulse 79 06/12/20 1254  Resp 18 06/12/20 1254  SpO2 98 % 06/12/20 1254  Vitals shown include unvalidated device data.  Last Pain:  Vitals:   06/12/20 1021  TempSrc: Oral  PainSc: 0-No pain      Patients Stated Pain Goal: 7 (06/12/20 1021)  Complications: No complications documented.

## 2020-06-12 NOTE — Brief Op Note (Signed)
06/12/2020  1:11 PM  PATIENT:  Marissa Saunders  29 y.o. female  PRE-OPERATIVE DIAGNOSIS:  CERVICAL DYSPLASIA CIN 2  POST-OPERATIVE DIAGNOSIS:  CERVICAL DYSPLASIA CIN 2  PROCEDURE:  Procedure(s): LOOP ELECTROSURGICAL EXCISION PROCEDURE (LEEP) (N/A)  SURGEON:  Surgeon(s) and Role:    Rogue Bussing, Basil Buffin, DO - Primary   ANESTHESIA:   local and MAC  EBL:  5 mL   LOCAL MEDICATIONS USED:  LIDOCAINE  and OTHER with epinephrine 10cc intracervical  DESCRIPTION OF PROCEDURE: Patient was taken to the operating room where anesthesia was administered and found to be adequate.  She was prepped and draped in the normal sterile fashion in dorsal lithotomy position.  A speculum was placed and the anterior lip of the cervix was grasped with a single tooth tenaculum.  Acetic acid was applied.  10cc of lidocaine with epi was injected circumferencially on face of cervix.  Large loop was used to cut and cauterize specimen.  This was removed and tagged and placed in specimen cup for transport to pathology.  ECC was collected and also sent separately.  Bovie ball was used to cauterize edges and base of excision.  Monsel's was applied.  Good hemostasis was noted.  Tenaculum was removed followed by speculum.  Patient tolerated the procedure well.  Sponge lap and needle counts were correct x 2. Patient was taken to recovery in stable position.  SPECIMEN:  Source of Specimen:  portionof cervix tagged at 12 oc  DISPOSITION OF SPECIMEN:  PATHOLOGY  COUNTS:  YES  PLAN OF CARE: Discharge to home after PACU  PATIENT DISPOSITION:  PACU - hemodynamically stable.   Delay start of Pharmacological VTE agent (>24hrs) due to surgical blood loss or risk of bleeding: not applicable

## 2020-06-12 NOTE — Anesthesia Preprocedure Evaluation (Addendum)
Anesthesia Evaluation  Patient identified by MRN, date of birth, ID band Patient awake    Reviewed: Allergy & Precautions, H&P , NPO status , Patient's Chart, lab work & pertinent test results  Airway Mallampati: I  TM Distance: >3 FB Neck ROM: Full    Dental no notable dental hx.    Pulmonary neg pulmonary ROS,    Pulmonary exam normal breath sounds clear to auscultation       Cardiovascular negative cardio ROS Normal cardiovascular exam Rhythm:Regular Rate:Normal     Neuro/Psych negative neurological ROS  negative psych ROS   GI/Hepatic Neg liver ROS, GERD  ,  Endo/Other  Morbid obesity  Renal/GU negative Renal ROS  negative genitourinary   Musculoskeletal negative musculoskeletal ROS (+)   Abdominal   Peds negative pediatric ROS (+)  Hematology negative hematology ROS (+)   Anesthesia Other Findings   Reproductive/Obstetrics negative OB ROS                            Anesthesia Physical Anesthesia Plan  ASA: III  Anesthesia Plan: General and MAC   Post-op Pain Management:    Induction: Intravenous  PONV Risk Score and Plan:   Airway Management Planned: LMA, Natural Airway and Simple Face Mask  Additional Equipment:   Intra-op Plan:   Post-operative Plan: Extubation in OR  Informed Consent: I have reviewed the patients History and Physical, chart, labs and discussed the procedure including the risks, benefits and alternatives for the proposed anesthesia with the patient or authorized representative who has indicated his/her understanding and acceptance.     Dental advisory given  Plan Discussed with: CRNA and Anesthesiologist  Anesthesia Plan Comments: (MAC with propofol gtt vs. GA/LMA depending on surgical needs. Both discussed with patient. Norton Blizzard, MD  )       Anesthesia Quick Evaluation

## 2020-06-12 NOTE — Anesthesia Procedure Notes (Signed)
Procedure Name: LMA Insertion Date/Time: 06/12/2020 12:04 PM Performed by: Bishop Limbo, CRNA Pre-anesthesia Checklist: Patient identified, Emergency Drugs available, Suction available and Patient being monitored Patient Re-evaluated:Patient Re-evaluated prior to induction Oxygen Delivery Method: Circle System Utilized Preoxygenation: Pre-oxygenation with 100% oxygen Induction Type: IV induction Ventilation: Mask ventilation without difficulty LMA: LMA inserted LMA Size: 4.0 Number of attempts: 1 Placement Confirmation: positive ETCO2 Tube secured with: Tape Dental Injury: Teeth and Oropharynx as per pre-operative assessment

## 2020-06-12 NOTE — Op Note (Signed)
06/12/2020  1:11 PM  PATIENT:  Marissa Saunders  29 y.o. female  PRE-OPERATIVE DIAGNOSIS:  CERVICAL DYSPLASIA CIN 2  POST-OPERATIVE DIAGNOSIS:  CERVICAL DYSPLASIA CIN 2  PROCEDURE:  Procedure(s): LOOP ELECTROSURGICAL EXCISION PROCEDURE (LEEP) (N/A)  SURGEON:  Surgeon(s) and Role:    * Dartanyan Deasis, DO - Primary   ANESTHESIA:   local and MAC  EBL:  5 mL   LOCAL MEDICATIONS USED:  LIDOCAINE  and OTHER with epinephrine 10cc intracervical  DESCRIPTION OF PROCEDURE: Patient was taken to the operating room where anesthesia was administered and found to be adequate.  She was prepped and draped in the normal sterile fashion in dorsal lithotomy position.  A speculum was placed and the anterior lip of the cervix was grasped with a single tooth tenaculum.  Acetic acid was applied.  10cc of lidocaine with epi was injected circumferencially on face of cervix.  Large loop was used to cut and cauterize specimen.  This was removed and tagged and placed in specimen cup for transport to pathology.  ECC was collected and also sent separately.  Bovie ball was used to cauterize edges and base of excision.  Monsel's was applied.  Good hemostasis was noted.  Tenaculum was removed followed by speculum.  Patient tolerated the procedure well.  Sponge lap and needle counts were correct x 2. Patient was taken to recovery in stable position.  SPECIMEN:  Source of Specimen:  portionof cervix tagged at 12 oc  DISPOSITION OF SPECIMEN:  PATHOLOGY  COUNTS:  YES  PLAN OF CARE: Discharge to home after PACU  PATIENT DISPOSITION:  PACU - hemodynamically stable.   Delay start of Pharmacological VTE agent (>24hrs) due to surgical blood loss or risk of bleeding: not applicable  

## 2020-06-13 ENCOUNTER — Encounter (HOSPITAL_BASED_OUTPATIENT_CLINIC_OR_DEPARTMENT_OTHER): Payer: Self-pay | Admitting: Obstetrics and Gynecology

## 2020-06-13 LAB — SURGICAL PATHOLOGY

## 2020-06-13 NOTE — Anesthesia Postprocedure Evaluation (Signed)
Anesthesia Post Note  Patient: Marissa Saunders  Procedure(s) Performed: LOOP ELECTROSURGICAL EXCISION PROCEDURE (LEEP) (N/A Vagina )     Patient location during evaluation: PACU Anesthesia Type: General Level of consciousness: awake and alert Pain management: pain level controlled Vital Signs Assessment: post-procedure vital signs reviewed and stable Respiratory status: spontaneous breathing, nonlabored ventilation and respiratory function stable Cardiovascular status: blood pressure returned to baseline and stable Postop Assessment: no apparent nausea or vomiting Anesthetic complications: no   No complications documented.  Last Vitals:  Vitals:   06/12/20 1330 06/12/20 1347  BP: 98/82 132/81  Pulse: 65 (!) 58  Resp: 18 18  Temp: 36.7 C   SpO2: 100% 99%    Last Pain:  Vitals:   06/12/20 1347  TempSrc:   PainSc: 2                  Mellody Dance

## 2020-07-01 DIAGNOSIS — Z419 Encounter for procedure for purposes other than remedying health state, unspecified: Secondary | ICD-10-CM | POA: Diagnosis not present

## 2020-08-01 DIAGNOSIS — Z419 Encounter for procedure for purposes other than remedying health state, unspecified: Secondary | ICD-10-CM | POA: Diagnosis not present

## 2020-08-29 DIAGNOSIS — Z419 Encounter for procedure for purposes other than remedying health state, unspecified: Secondary | ICD-10-CM | POA: Diagnosis not present

## 2020-09-29 DIAGNOSIS — Z419 Encounter for procedure for purposes other than remedying health state, unspecified: Secondary | ICD-10-CM | POA: Diagnosis not present

## 2020-10-29 DIAGNOSIS — Z419 Encounter for procedure for purposes other than remedying health state, unspecified: Secondary | ICD-10-CM | POA: Diagnosis not present

## 2020-11-29 DIAGNOSIS — Z419 Encounter for procedure for purposes other than remedying health state, unspecified: Secondary | ICD-10-CM | POA: Diagnosis not present

## 2020-12-29 DIAGNOSIS — Z419 Encounter for procedure for purposes other than remedying health state, unspecified: Secondary | ICD-10-CM | POA: Diagnosis not present

## 2021-03-17 DIAGNOSIS — H5213 Myopia, bilateral: Secondary | ICD-10-CM | POA: Diagnosis not present

## 2021-04-23 IMAGING — US US OB COMP LESS 14 WK
1 series · 15 of 28 positions shown · non-contrast
Comparison: None.

CLINICAL DATA: Vaginal bleeding and first-trimester pregnancy

EXAM:
OBSTETRIC <14 WK ULTRASOUND
TECHNIQUE: Transabdominal ultrasound was performed for evaluation of the
gestation as well as the maternal uterus and adnexal regions.

[Series 1: us ob comp less 14 wk · 15 of 35 slices shown]
[im 1/35]
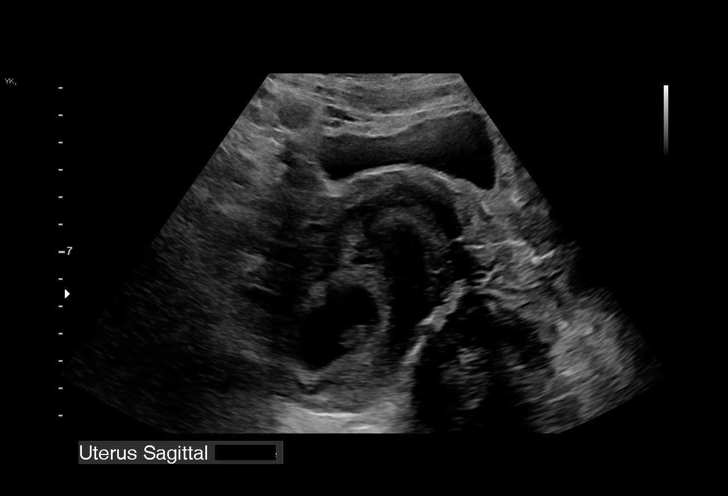
[im 3/35]
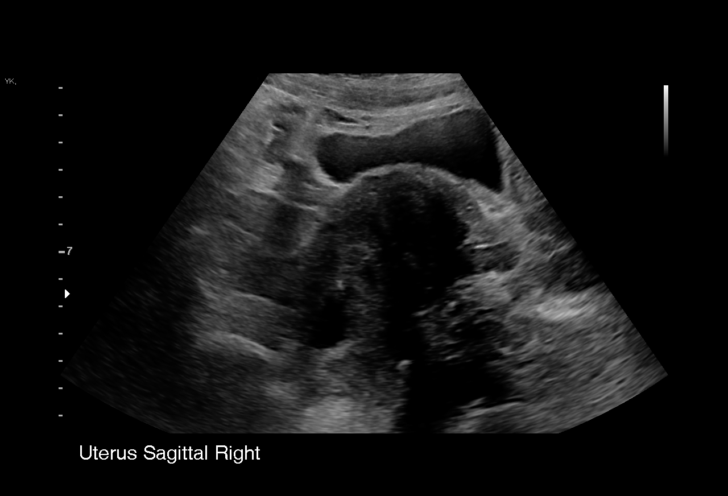
[im 6/35]
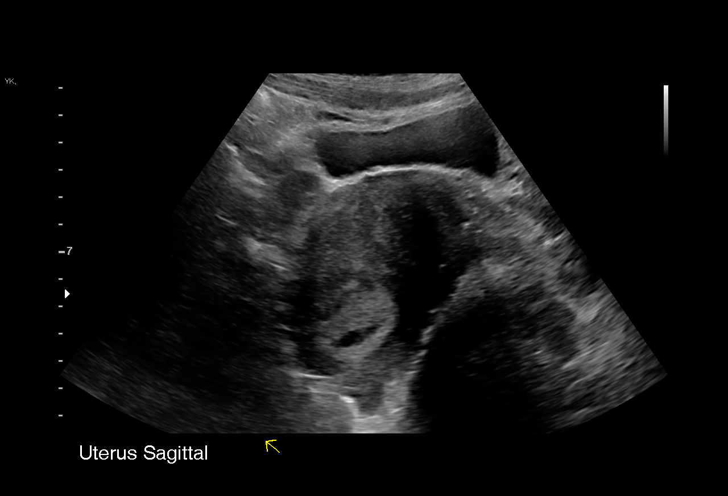
[im 8/35]
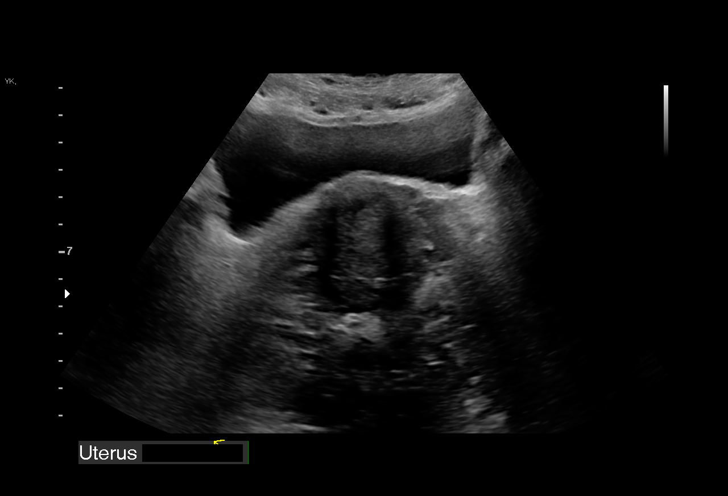
[im 11/35]
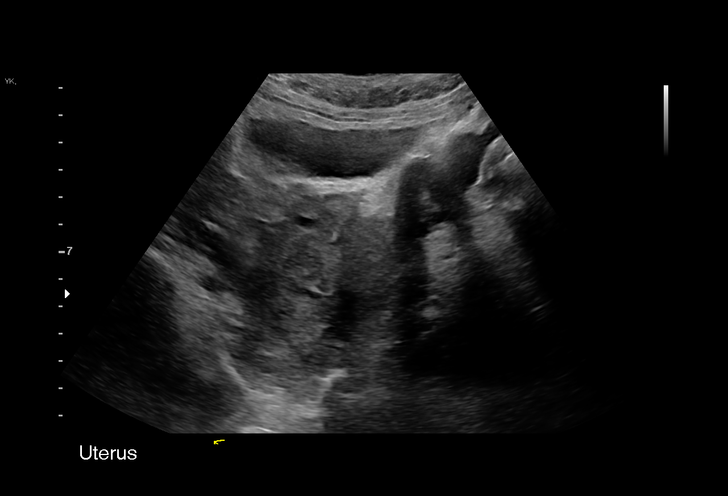
[im 13/35]
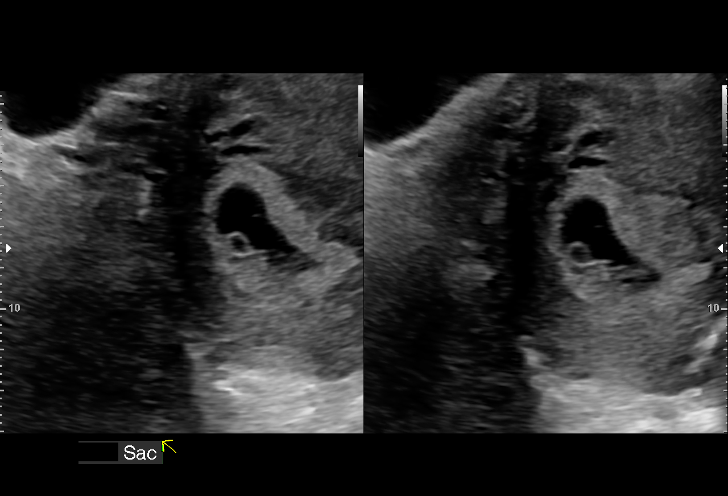
[im 16/35]
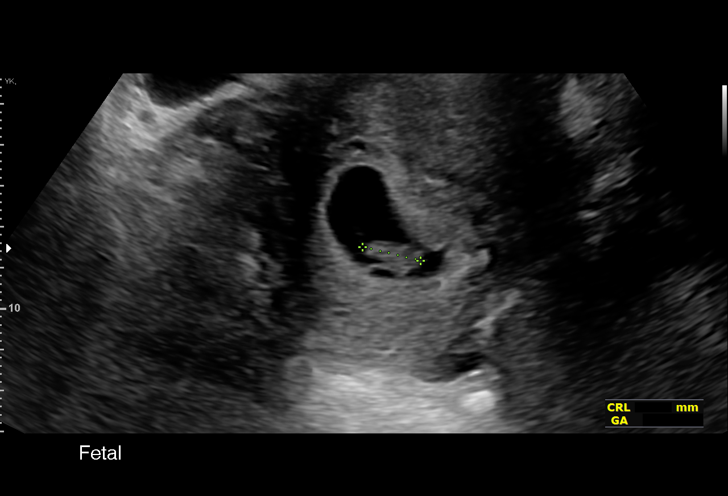
[im 18/35]
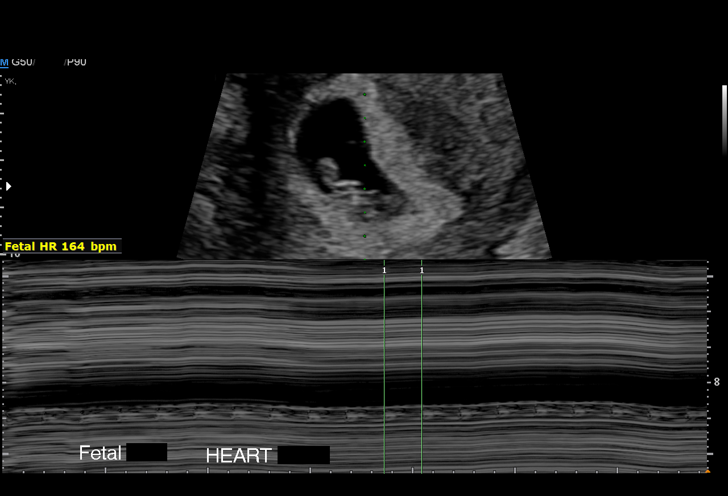
[im 19/35]
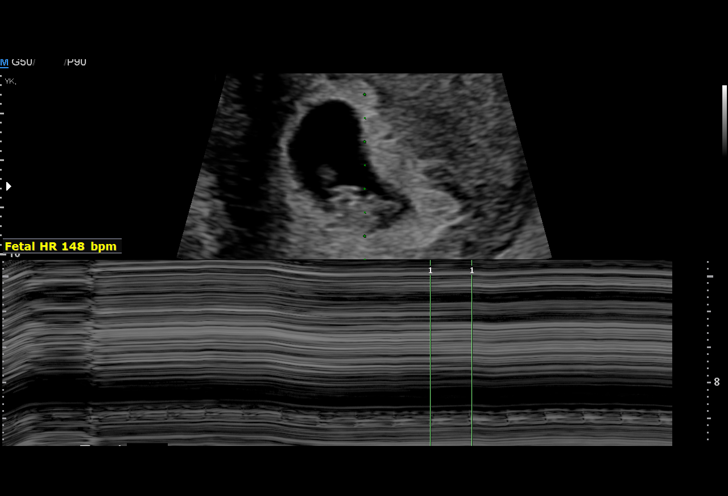
[im 22/35]
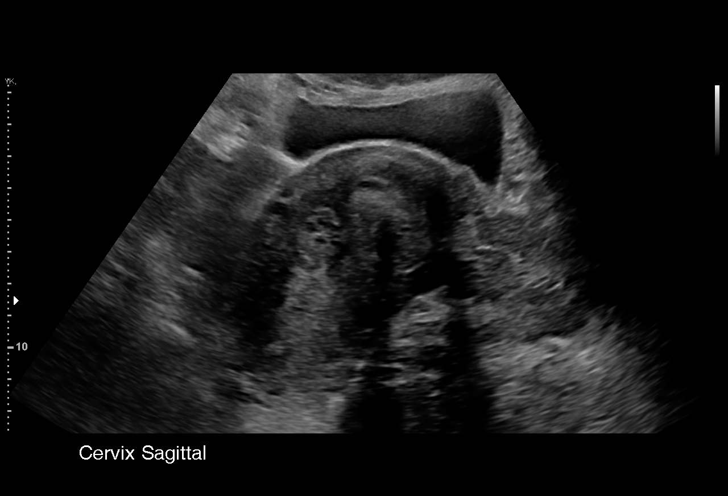
[im 24/35]
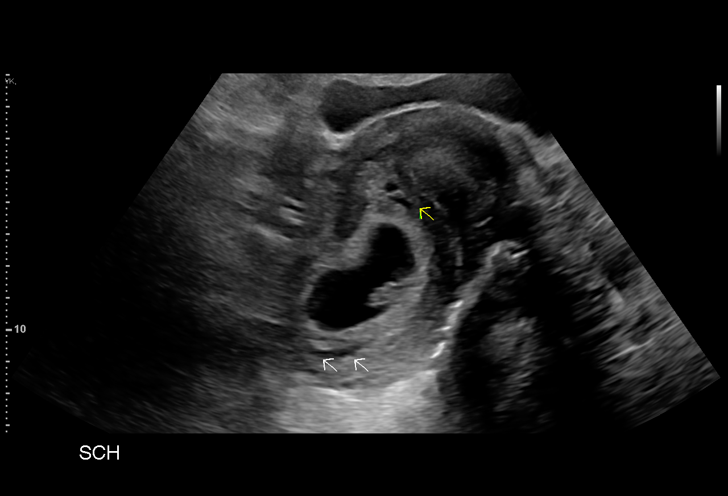
[im 27/35]
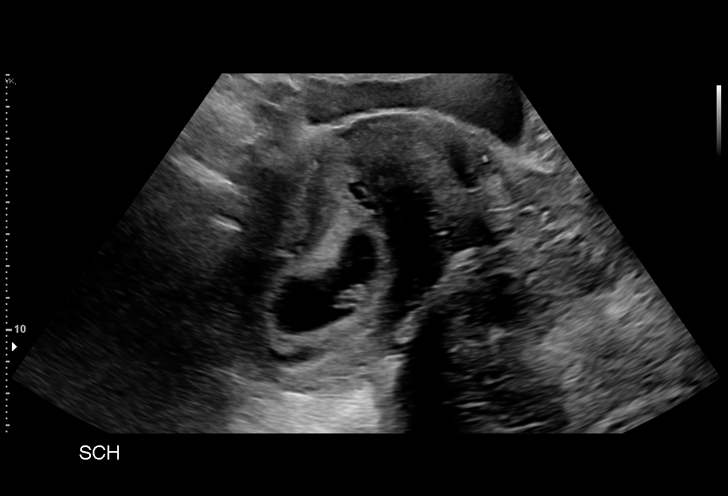
[im 29/35]
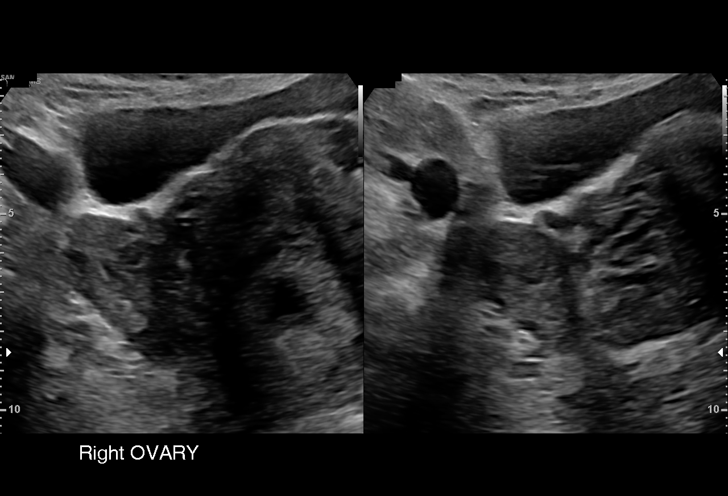
[im 32/35]
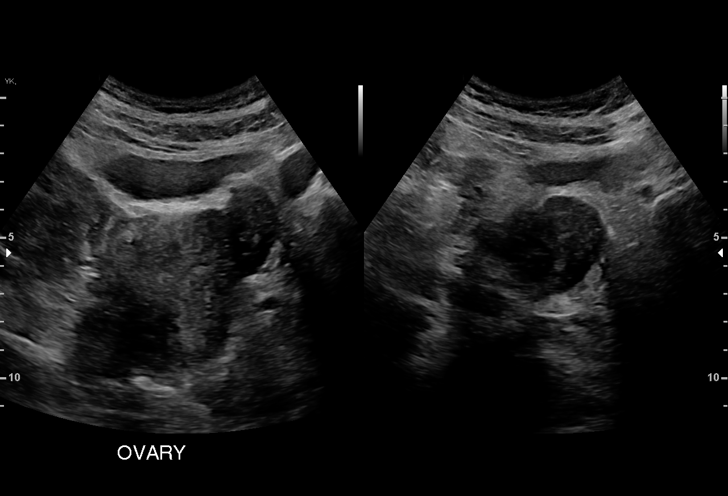
[im 35/35]
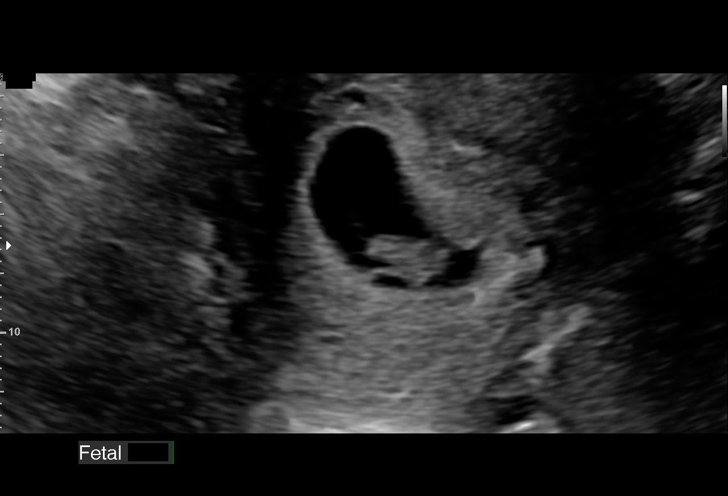

[15 of 28 positions shown; findings below may reference images not displayed]

FINDINGS: Intrauterine gestational sac: Single

Yolk sac:  Visualized.

Embryo:  Visualized.

Cardiac Activity: Visualized.

Heart Rate: 164 bpm

CRL:   14.5 mm   7 w 5 d                  US EDC: 03/20/2020

Subchorionic hemorrhage: Present to a mild degree within collections
along the upper and lower sac measuring up to 14 x 4 mm at the level
of the upper sac.

Maternal uterus/adnexae: Retroflexed uterus.  No adnexal mass.
IMPRESSION: 1. Single living intrauterine pregnancy measuring 7 weeks 5 days.
2. Small volume subchorionic hemorrhage as noted above.

## 2021-11-18 ENCOUNTER — Inpatient Hospital Stay (HOSPITAL_COMMUNITY): Payer: Medicaid Other

## 2021-11-18 ENCOUNTER — Inpatient Hospital Stay (HOSPITAL_COMMUNITY)
Admission: AD | Admit: 2021-11-18 | Discharge: 2021-11-18 | Disposition: A | Discharge status: Home / Self Care | Payer: Medicaid Other | Attending: Obstetrics and Gynecology | Admitting: Obstetrics and Gynecology

## 2021-11-18 ENCOUNTER — Other Ambulatory Visit: Payer: Self-pay

## 2021-11-18 ENCOUNTER — Encounter (HOSPITAL_COMMUNITY): Payer: Self-pay | Admitting: *Deleted

## 2021-11-18 DIAGNOSIS — Z3201 Encounter for pregnancy test, result positive: Secondary | ICD-10-CM | POA: Diagnosis present

## 2021-11-18 DIAGNOSIS — Z3A01 Less than 8 weeks gestation of pregnancy: Secondary | ICD-10-CM | POA: Insufficient documentation

## 2021-11-18 DIAGNOSIS — O209 Hemorrhage in early pregnancy, unspecified: Secondary | ICD-10-CM | POA: Diagnosis not present

## 2021-11-18 DIAGNOSIS — Z3491 Encounter for supervision of normal pregnancy, unspecified, first trimester: Secondary | ICD-10-CM

## 2021-11-18 LAB — POCT PREGNANCY, URINE: Preg Test, Ur: POSITIVE — AB

## 2021-11-18 LAB — CBC
HCT: 36.8 % (ref 36.0–46.0)
Hemoglobin: 11.5 g/dL — ABNORMAL LOW (ref 12.0–15.0)
MCH: 26.3 pg (ref 26.0–34.0)
MCHC: 31.3 g/dL (ref 30.0–36.0)
MCV: 84 fL (ref 80.0–100.0)
Platelets: 248 10*3/uL (ref 150–400)
RBC: 4.38 MIL/uL (ref 3.87–5.11)
RDW: 15.7 % — ABNORMAL HIGH (ref 11.5–15.5)
WBC: 7.9 10*3/uL (ref 4.0–10.5)
nRBC: 0 % (ref 0.0–0.2)

## 2021-11-18 LAB — URINALYSIS, ROUTINE W REFLEX MICROSCOPIC
Bacteria, UA: NONE SEEN
Bilirubin Urine: NEGATIVE
Glucose, UA: NEGATIVE mg/dL
Ketones, ur: NEGATIVE mg/dL
Leukocytes,Ua: NEGATIVE
Nitrite: NEGATIVE
Protein, ur: NEGATIVE mg/dL
Specific Gravity, Urine: 1.013 (ref 1.005–1.030)
pH: 7 (ref 5.0–8.0)

## 2021-11-18 LAB — WET PREP, GENITAL
Sperm: NONE SEEN
Trich, Wet Prep: NONE SEEN
WBC, Wet Prep HPF POC: <10 (ref <10)
Yeast Wet Prep HPF POC: NONE SEEN

## 2021-11-18 LAB — HCG, QUANTITATIVE, PREGNANCY: hCG, Beta Chain, Quant, S: 11049 m[IU]/mL — ABNORMAL HIGH (ref <5)

## 2021-11-18 NOTE — Discharge Instructions (Signed)
Return to care  If you have heavier bleeding that soaks through more than 2 pads per hour for an hour or more If you bleed so much that you feel like you might pass out or you do pass out If you have significant abdominal pain that is not improved with Tylenol   

## 2021-11-18 NOTE — MAU Note (Addendum)
Marissa Saunders is a 31 y.o. at Unknown here in MAU reporting: intermittent spotting with a lot of walking and standing.  Denies abdominal pain or cramping.  States currently has lower back pain LMP: 10/06/2021 Onset of complaint: Friday Pain score: 2/10 Vitals:   11/18/21 1531  BP: 136/79  Pulse: 61  Resp: 20  Temp: 98.8 F (37.1 C)  SpO2: 100%     FHT:N/A Lab orders placed from triage:   UPT & UA

## 2021-11-19 ENCOUNTER — Inpatient Hospital Stay (HOSPITAL_COMMUNITY)
Admission: AD | Admit: 2021-11-19 | Discharge: 2021-11-19 | Disposition: A | Discharge status: Home / Self Care | Payer: Medicaid Other | Attending: Obstetrics and Gynecology | Admitting: Obstetrics and Gynecology

## 2021-11-19 ENCOUNTER — Encounter (HOSPITAL_COMMUNITY): Payer: Self-pay | Admitting: Obstetrics and Gynecology

## 2021-11-19 ENCOUNTER — Inpatient Hospital Stay (HOSPITAL_COMMUNITY): Payer: Medicaid Other

## 2021-11-19 ENCOUNTER — Other Ambulatory Visit: Payer: Self-pay

## 2021-11-19 DIAGNOSIS — O26891 Other specified pregnancy related conditions, first trimester: Secondary | ICD-10-CM | POA: Insufficient documentation

## 2021-11-19 DIAGNOSIS — O039 Complete or unspecified spontaneous abortion without complication: Secondary | ICD-10-CM | POA: Diagnosis not present

## 2021-11-19 DIAGNOSIS — Z3A01 Less than 8 weeks gestation of pregnancy: Secondary | ICD-10-CM | POA: Insufficient documentation

## 2021-11-19 LAB — GC/CHLAMYDIA PROBE AMP (~~LOC~~) NOT AT ARMC
Chlamydia: NEGATIVE
Comment: NEGATIVE
Comment: NORMAL
Neisseria Gonorrhea: NEGATIVE

## 2021-11-19 LAB — HCG, QUANTITATIVE, PREGNANCY: hCG, Beta Chain, Quant, S: 10458 m[IU]/mL — ABNORMAL HIGH (ref <5)

## 2021-11-19 MED ORDER — IBUPROFEN 600 MG PO TABS: 600.0000 mg | ORAL_TABLET | Freq: Four times a day (QID) | ORAL | 3 refills | Status: DC | PRN

## 2021-11-19 NOTE — MAU Note (Signed)
Pt says says has  was here yesterday - spotting Then today started at 4-5 pm- passing clots - and cramping-  Pad on in Triage- small amt red blood Now cramping - is a 4 - was a 10

## 2021-11-19 NOTE — MAU Provider Note (Signed)
Chief Complaint:  Vaginal Bleeding   Event Date/Time   First Provider Initiated Contact with Patient 11/19/21 2059     HPI: Marissa Saunders is a 31 y.o. G3P2002 at 4764w2d who presents to maternity admissions reporting heavy bleeding with large golf-ball sized clots and painful (8/10) cramping in waves just before passing large clots. Had one last episode of severe cramping with large clots right before coming here, since arrival to MAU cramping has decreased to 2-3/10 with low back pain and bleeding has decreased to more like a regular period without clots. Denies any dizziness, nausea/vomiting or syncope. Was seen in MAU yesterday for spotting, everything at the time was normal and she was sent home with bleeding precautions. No other physical complaints.   Pregnancy Course: Planning to receive OB care at [facility] (where she is an established patient).  Past Medical History:  Diagnosis Date   Abnormal Pap smear, low grade squamous intraepithelial lesion (LGSIL) 03/25/2012   GERD (gastroesophageal reflux disease)    OB History  Gravida Para Term Preterm AB Living  3 2 2     2   SAB IAB Ectopic Multiple Live Births          2    # Outcome Date GA Lbr Len/2nd Weight Sex Delivery Anes PTL Lv  3 Current           2 Term 03/18/20 4766w0d    Vag-Spont   LIV  1 Term 08/15/12 6641w2d 05:20 / 00:50 7 lb 13.6 oz (3.56 kg) M Vag-Spont None  LIV   Past Surgical History:  Procedure Laterality Date   ANAL FISSURE REPAIR  05/02/11   LAPAROSCOPIC GASTRIC SLEEVE RESECTION  2016   LEEP N/A 06/12/2020   Procedure: LOOP ELECTROSURGICAL EXCISION PROCEDURE (LEEP);  Surgeon: Philip Aspenallahan, Sidney, DO;  [facility];  Service: Gynecology;  Laterality: N/A;   Family History  Problem Relation Age of Onset   Diabetes Mother    Asthma Mother    Lupus Mother    Hypertension Father    Liver disease Maternal Grandfather    Cancer Neg Hx    Social History   Tobacco Use   Smoking  status: Former    Types: Cigarettes   Smokeless tobacco: Never  Vaping Use   Vaping Use: Former  Substance Use Topics   Alcohol use: Yes    Comment: social   Drug use: No   No Known Allergies No medications prior to admission.   I have reviewed patient's Past Medical Hx, Surgical Hx, Family Hx, Social Hx, medications and allergies.   ROS:  Pertinent items noted in HPI and remainder of comprehensive ROS otherwise negative.   Physical Exam  Patient Vitals for the past 24 hrs:  BP Temp Temp src Pulse Resp Height Weight  11/19/21 2252 122/62 -- -- 62 -- -- --  11/19/21 2034 (!) 122/59 98.7 F (37.1 C) Oral 60 20 5\' 5"  (1.651 m) 280 lb 12.8 oz (127.4 kg)   Physical Exam Vitals and nursing note reviewed.  Constitutional:      General: She is not in acute distress.    Appearance: Normal appearance. She is not ill-appearing.  HENT:     Head: Normocephalic and atraumatic.  Eyes:     Pupils: Pupils are equal, round, and reactive to light.  Cardiovascular:     Rate and Rhythm: Normal rate and regular rhythm.  Pulmonary:     Effort: Pulmonary effort is normal.  Abdominal:  Palpations: Abdomen is soft.     Tenderness: There is no abdominal tenderness.  Genitourinary:    Comments: No pelvic exam, small amount of bright red blood noted on pad (about worth of bleeding). Sent to U/S. Musculoskeletal:        General: Normal range of motion.  Skin:    General: Skin is warm.     Capillary Refill: Capillary refill takes less than 2 seconds.  Neurological:     Mental Status: She is alert and oriented to person, place, and time.  Psychiatric:        Mood and Affect: Mood normal.        Behavior: Behavior normal.        Thought Content: Thought content normal.        Judgment: Judgment normal.    Labs: Results for orders placed or performed during the hospital encounter of 11/19/21 (from the past 24 hour(s))  hCG, quantitative, pregnancy     Status: Abnormal   Collection  Time: 11/19/21  9:18 PM  Result Value Ref Range   hCG, Beta Chain, Quant, S 10,458 (H) <5 mIU/mL  (Small decrease from yesterday)   Imaging:  US OB Transvaginal  Result Date: 11/19/2021 CLINICAL DATA:  Increased bleeding. Estimated gestational age by LMP is 6 weeks 2 days. Quantitative beta HCG is in process. EXAM: TRANSVAGINAL OB ULTRASOUND TECHNIQUE: Transvaginal ultrasound was performed for complete evaluation of the gestation as well as the maternal uterus, adnexal regions, and pelvic cul-de-sac. COMPARISON:  11/18/2021 FINDINGS: Intrauterine gestational sac: No intrauterine gestational sac is seen on today's examination. Yolk sac:  Not Visualized. Embryo:  Not Visualized. Cardiac Activity: Not Visualized. Maternal uterus/adnexae: Uterus is retroverted. No myometrial mass lesions. The endometrium is somewhat heterogeneous with increased vascularity on color flow Doppler. Endometrial stripe thickness measures about 12 mm. Both ovaries are visualized and appear normal. No abnormal adnexal masses. Minimal free fluid in the pelvis. IMPRESSION: No intrauterine gestational sac is seen on today's study. The gestational sac seen on the prior study is no longer present consistent with interval loss of pregnancy. Electronically Signed   By: Burman Nieves M.D.   On: 11/19/2021 22:14    MAU Course: Orders Placed This Encounter  Procedures   US OB Transvaginal   hCG, quantitative, pregnancy   Discharge patient   Meds ordered this encounter  Medications   ibuprofen (ADVIL) 600 MG tablet    Sig: Take 1 tablet (600 mg total) by mouth every 6 (six) hours as needed.    Dispense:  60 tablet    Refill:  3    Order Specific Question:   Supervising Provider    Answer:   Samara Snide   MDM: Discussed possibilities of such heavy bleeding, pt amenable to repeated bloodwork and ultrasound since high suspicion she has passed the gestational sac. Sent to U/S.  Reviewed results of ultrasound,  confirming miscarriage. Pt stated "I expected this, I've already cried about it, I'm ok now." Reviewed expected progression of bleeding and reviewed reasons to return to MAU. Advised to take ibuprofen for the cramping, can also use a heating pad and rest. Will route note to [facility], pt to call in the morning for follow up.  Assessment: 1. Spontaneous miscarriage    Plan: Discharge home in stable condition with return precautions     Follow-up Information     Ob/Gyn, Nestor Ramp. Call in 1 day(s).   Contact information: [address]  [place] [address] [phone]                 Allergies as of 11/19/2021   No Known Allergies      Medication List     TAKE these medications    ibuprofen 600 MG tablet Commonly known as: ADVIL Take 1 tablet (600 mg total) by mouth every 6 (six) hours as needed.   prenatal multivitamin Tabs tablet Take 1 tablet by mouth daily at 12 noon.       Edd Arbour, CNM, MSN, IBCLC Certified Nurse Midwife, [facility]

## 2022-03-06 LAB — OB RESULTS CONSOLE GC/CHLAMYDIA
Chlamydia: NEGATIVE
Chlamydia: NEGATIVE
Neisseria Gonorrhea: NEGATIVE
Neisseria Gonorrhea: NEGATIVE

## 2022-03-20 LAB — OB RESULTS CONSOLE RUBELLA ANTIBODY, IGM
Rubella: IMMUNE
Rubella: IMMUNE

## 2022-03-20 LAB — OB RESULTS CONSOLE HIV ANTIBODY (ROUTINE TESTING)
HIV: NONREACTIVE
HIV: NONREACTIVE

## 2022-03-20 LAB — HEPATITIS C ANTIBODY
HCV Ab: NEGATIVE
HCV Ab: NEGATIVE

## 2022-03-20 LAB — OB RESULTS CONSOLE RPR
RPR: NONREACTIVE
RPR: NONREACTIVE

## 2022-03-20 LAB — OB RESULTS CONSOLE HEPATITIS B SURFACE ANTIGEN
Hepatitis B Surface Ag: NEGATIVE
Hepatitis B Surface Ag: NEGATIVE

## 2022-03-20 LAB — OB RESULTS CONSOLE ABO/RH: RH Type: POSITIVE

## 2022-03-20 LAB — OB RESULTS CONSOLE ANTIBODY SCREEN: Antibody Screen: NEGATIVE

## 2022-03-26 ENCOUNTER — Other Ambulatory Visit: Payer: Self-pay

## 2022-04-26 ENCOUNTER — Other Ambulatory Visit: Payer: Self-pay | Admitting: Obstetrics and Gynecology

## 2022-04-26 DIAGNOSIS — Z363 Encounter for antenatal screening for malformations: Secondary | ICD-10-CM

## 2022-05-15 ENCOUNTER — Ambulatory Visit: Payer: Medicaid Other | Admitting: *Deleted

## 2022-05-15 ENCOUNTER — Ambulatory Visit: Payer: Medicaid Other | Attending: Obstetrics and Gynecology

## 2022-05-15 ENCOUNTER — Other Ambulatory Visit: Payer: Self-pay | Admitting: *Deleted

## 2022-05-15 VITALS — BP 129/62 | HR 78

## 2022-05-15 DIAGNOSIS — O99212 Obesity complicating pregnancy, second trimester: Secondary | ICD-10-CM

## 2022-05-15 DIAGNOSIS — Z363 Encounter for antenatal screening for malformations: Secondary | ICD-10-CM | POA: Diagnosis present

## 2022-05-15 DIAGNOSIS — Z362 Encounter for other antenatal screening follow-up: Secondary | ICD-10-CM

## 2022-06-18 ENCOUNTER — Ambulatory Visit: Payer: Medicaid Other | Attending: Maternal & Fetal Medicine

## 2022-06-18 ENCOUNTER — Encounter: Payer: Self-pay | Admitting: *Deleted

## 2022-06-18 ENCOUNTER — Ambulatory Visit: Payer: Medicaid Other | Admitting: *Deleted

## 2022-06-18 DIAGNOSIS — E669 Obesity, unspecified: Secondary | ICD-10-CM

## 2022-06-18 DIAGNOSIS — O99842 Bariatric surgery status complicating pregnancy, second trimester: Secondary | ICD-10-CM | POA: Diagnosis not present

## 2022-06-18 DIAGNOSIS — Z3A23 23 weeks gestation of pregnancy: Secondary | ICD-10-CM

## 2022-06-18 DIAGNOSIS — O99212 Obesity complicating pregnancy, second trimester: Secondary | ICD-10-CM

## 2022-06-18 DIAGNOSIS — Z362 Encounter for other antenatal screening follow-up: Secondary | ICD-10-CM

## 2022-06-18 DIAGNOSIS — O3442 Maternal care for other abnormalities of cervix, second trimester: Secondary | ICD-10-CM

## 2022-06-19 ENCOUNTER — Other Ambulatory Visit: Payer: Self-pay | Admitting: *Deleted

## 2022-06-19 DIAGNOSIS — Z903 Acquired absence of stomach [part of]: Secondary | ICD-10-CM

## 2022-06-19 DIAGNOSIS — O3442 Maternal care for other abnormalities of cervix, second trimester: Secondary | ICD-10-CM

## 2022-06-19 DIAGNOSIS — O99212 Obesity complicating pregnancy, second trimester: Secondary | ICD-10-CM

## 2022-07-01 NOTE — L&D Delivery Note (Signed)
Delivery Note Patient progressed along a normal labor curve.  At 4:10 PM a viable female was delivered via Vaginal, Spontaneous (Presentation: Right Occiput Anterior).  APGAR: 8, 9; weight 8lb 3.2 oz (3720 gm ) .   Placenta status: Spontaneous, Intact.  Cord: 3 vessels with the following complications: None.  Cord pH: n/a  Anesthesia: None Episiotomy: None Lacerations:  2nd degree perineal Suture Repair: 2.0 vicryl rapide Est. Blood Loss (mL): 50  Mom to postpartum.  Baby to Couplet care / Skin to Skin.  [place] 10/02/2022, 5:00 PM

## 2022-07-22 ENCOUNTER — Ambulatory Visit: Payer: Medicaid Other | Attending: Maternal & Fetal Medicine

## 2022-07-22 ENCOUNTER — Other Ambulatory Visit: Payer: Self-pay | Admitting: *Deleted

## 2022-07-22 ENCOUNTER — Encounter: Payer: Self-pay | Admitting: *Deleted

## 2022-07-22 ENCOUNTER — Ambulatory Visit: Payer: Medicaid Other | Admitting: *Deleted

## 2022-07-22 DIAGNOSIS — O3442 Maternal care for other abnormalities of cervix, second trimester: Secondary | ICD-10-CM | POA: Insufficient documentation

## 2022-07-22 DIAGNOSIS — E669 Obesity, unspecified: Secondary | ICD-10-CM

## 2022-07-22 DIAGNOSIS — Z9889 Other specified postprocedural states: Secondary | ICD-10-CM | POA: Insufficient documentation

## 2022-07-22 DIAGNOSIS — O99843 Bariatric surgery status complicating pregnancy, third trimester: Secondary | ICD-10-CM

## 2022-07-22 DIAGNOSIS — Z903 Acquired absence of stomach [part of]: Secondary | ICD-10-CM | POA: Insufficient documentation

## 2022-07-22 DIAGNOSIS — O99213 Obesity complicating pregnancy, third trimester: Secondary | ICD-10-CM

## 2022-07-22 DIAGNOSIS — O3443 Maternal care for other abnormalities of cervix, third trimester: Secondary | ICD-10-CM

## 2022-07-22 DIAGNOSIS — O99212 Obesity complicating pregnancy, second trimester: Secondary | ICD-10-CM | POA: Insufficient documentation

## 2022-07-22 DIAGNOSIS — Z9884 Bariatric surgery status: Secondary | ICD-10-CM

## 2022-07-22 DIAGNOSIS — Z3A28 28 weeks gestation of pregnancy: Secondary | ICD-10-CM

## 2022-08-20 ENCOUNTER — Ambulatory Visit: Payer: Medicaid Other | Attending: Maternal & Fetal Medicine

## 2022-08-20 ENCOUNTER — Other Ambulatory Visit: Payer: Self-pay

## 2022-08-20 ENCOUNTER — Ambulatory Visit: Payer: Medicaid Other

## 2022-08-20 VITALS — BP 127/74 | HR 76

## 2022-08-20 DIAGNOSIS — O99213 Obesity complicating pregnancy, third trimester: Secondary | ICD-10-CM

## 2022-08-20 DIAGNOSIS — E669 Obesity, unspecified: Secondary | ICD-10-CM | POA: Diagnosis not present

## 2022-08-20 DIAGNOSIS — Z9889 Other specified postprocedural states: Secondary | ICD-10-CM | POA: Diagnosis present

## 2022-08-20 DIAGNOSIS — O99843 Bariatric surgery status complicating pregnancy, third trimester: Secondary | ICD-10-CM | POA: Diagnosis not present

## 2022-08-20 DIAGNOSIS — O3443 Maternal care for other abnormalities of cervix, third trimester: Secondary | ICD-10-CM | POA: Insufficient documentation

## 2022-08-20 DIAGNOSIS — Z9884 Bariatric surgery status: Secondary | ICD-10-CM | POA: Diagnosis present

## 2022-08-20 DIAGNOSIS — O99212 Obesity complicating pregnancy, second trimester: Secondary | ICD-10-CM | POA: Diagnosis not present

## 2022-08-20 DIAGNOSIS — O3442 Maternal care for other abnormalities of cervix, second trimester: Secondary | ICD-10-CM | POA: Insufficient documentation

## 2022-08-20 DIAGNOSIS — Z903 Acquired absence of stomach [part of]: Secondary | ICD-10-CM

## 2022-08-20 DIAGNOSIS — Z3A32 32 weeks gestation of pregnancy: Secondary | ICD-10-CM

## 2022-08-20 DIAGNOSIS — Z3689 Encounter for other specified antenatal screening: Secondary | ICD-10-CM

## 2022-08-20 DIAGNOSIS — Z87410 Personal history of cervical dysplasia: Secondary | ICD-10-CM

## 2022-08-27 ENCOUNTER — Ambulatory Visit: Payer: Medicaid Other | Attending: Maternal & Fetal Medicine

## 2022-08-27 ENCOUNTER — Ambulatory Visit: Payer: Medicaid Other

## 2022-08-27 VITALS — BP 149/65 | HR 91

## 2022-08-27 DIAGNOSIS — Z9884 Bariatric surgery status: Secondary | ICD-10-CM

## 2022-08-27 DIAGNOSIS — O99213 Obesity complicating pregnancy, third trimester: Secondary | ICD-10-CM | POA: Diagnosis not present

## 2022-08-27 DIAGNOSIS — O3443 Maternal care for other abnormalities of cervix, third trimester: Secondary | ICD-10-CM | POA: Diagnosis present

## 2022-08-27 DIAGNOSIS — Z3689 Encounter for other specified antenatal screening: Secondary | ICD-10-CM | POA: Diagnosis present

## 2022-08-27 DIAGNOSIS — O99843 Bariatric surgery status complicating pregnancy, third trimester: Secondary | ICD-10-CM | POA: Diagnosis not present

## 2022-08-27 DIAGNOSIS — O99891 Other specified diseases and conditions complicating pregnancy: Secondary | ICD-10-CM

## 2022-08-27 DIAGNOSIS — R030 Elevated blood-pressure reading, without diagnosis of hypertension: Secondary | ICD-10-CM

## 2022-08-27 DIAGNOSIS — E669 Obesity, unspecified: Secondary | ICD-10-CM

## 2022-08-27 DIAGNOSIS — Z3A33 33 weeks gestation of pregnancy: Secondary | ICD-10-CM

## 2022-09-02 ENCOUNTER — Encounter: Payer: Self-pay | Admitting: *Deleted

## 2022-09-02 ENCOUNTER — Ambulatory Visit: Payer: Medicaid Other | Admitting: *Deleted

## 2022-09-02 ENCOUNTER — Ambulatory Visit: Payer: Medicaid Other | Attending: Maternal & Fetal Medicine

## 2022-09-02 DIAGNOSIS — Z9889 Other specified postprocedural states: Secondary | ICD-10-CM

## 2022-09-02 DIAGNOSIS — O99213 Obesity complicating pregnancy, third trimester: Secondary | ICD-10-CM

## 2022-09-02 DIAGNOSIS — Z9884 Bariatric surgery status: Secondary | ICD-10-CM | POA: Diagnosis present

## 2022-09-02 DIAGNOSIS — O3443 Maternal care for other abnormalities of cervix, third trimester: Secondary | ICD-10-CM | POA: Diagnosis present

## 2022-09-02 DIAGNOSIS — Z3A34 34 weeks gestation of pregnancy: Secondary | ICD-10-CM | POA: Diagnosis not present

## 2022-09-02 DIAGNOSIS — O99843 Bariatric surgery status complicating pregnancy, third trimester: Secondary | ICD-10-CM | POA: Diagnosis not present

## 2022-09-02 DIAGNOSIS — E669 Obesity, unspecified: Secondary | ICD-10-CM

## 2022-09-09 ENCOUNTER — Other Ambulatory Visit: Payer: Self-pay | Admitting: Maternal & Fetal Medicine

## 2022-09-09 ENCOUNTER — Ambulatory Visit: Payer: Medicaid Other | Admitting: *Deleted

## 2022-09-09 ENCOUNTER — Ambulatory Visit: Payer: Medicaid Other | Attending: Maternal & Fetal Medicine

## 2022-09-09 ENCOUNTER — Ambulatory Visit: Payer: Medicaid Other

## 2022-09-09 VITALS — BP 138/63 | HR 85

## 2022-09-09 DIAGNOSIS — O99213 Obesity complicating pregnancy, third trimester: Secondary | ICD-10-CM | POA: Diagnosis present

## 2022-09-09 DIAGNOSIS — O3443 Maternal care for other abnormalities of cervix, third trimester: Secondary | ICD-10-CM | POA: Diagnosis present

## 2022-09-09 DIAGNOSIS — Z9884 Bariatric surgery status: Secondary | ICD-10-CM

## 2022-09-09 DIAGNOSIS — Z3A35 35 weeks gestation of pregnancy: Secondary | ICD-10-CM

## 2022-09-09 DIAGNOSIS — E669 Obesity, unspecified: Secondary | ICD-10-CM

## 2022-09-09 DIAGNOSIS — O368390 Maternal care for abnormalities of the fetal heart rate or rhythm, unspecified trimester, not applicable or unspecified: Secondary | ICD-10-CM | POA: Diagnosis present

## 2022-09-09 DIAGNOSIS — O99843 Bariatric surgery status complicating pregnancy, third trimester: Secondary | ICD-10-CM

## 2022-09-09 DIAGNOSIS — Z9889 Other specified postprocedural states: Secondary | ICD-10-CM | POA: Diagnosis not present

## 2022-09-09 NOTE — Procedures (Signed)
Marissa Saunders 05-14-1991 [redacted]w[redacted]d Fetus A Non-Stress Test Interpretation for 09/09/22  Indication:  Possible decel or arrhythmia noted during U/S  Fetal Heart Rate A Mode: External Baseline Rate (A): 135 bpm Variability: Moderate Accelerations: 15 x 15 Decelerations: None Multiple birth?: No  Uterine Activity Mode: Palpation, Toco Contraction Frequency (min): None  Interpretation (Fetal Testing) Nonstress Test Interpretation: Reactive Comments: Dr. SEpimenio Sarinreviewed tracing.

## 2022-09-17 ENCOUNTER — Other Ambulatory Visit: Payer: Self-pay | Admitting: Maternal & Fetal Medicine

## 2022-09-17 ENCOUNTER — Ambulatory Visit: Payer: Medicaid Other | Attending: Maternal & Fetal Medicine

## 2022-09-17 ENCOUNTER — Other Ambulatory Visit: Payer: Self-pay | Admitting: *Deleted

## 2022-09-17 ENCOUNTER — Ambulatory Visit: Payer: Medicaid Other | Admitting: *Deleted

## 2022-09-17 ENCOUNTER — Encounter: Payer: Self-pay | Admitting: *Deleted

## 2022-09-17 DIAGNOSIS — Z3689 Encounter for other specified antenatal screening: Secondary | ICD-10-CM | POA: Diagnosis present

## 2022-09-17 DIAGNOSIS — E669 Obesity, unspecified: Secondary | ICD-10-CM

## 2022-09-17 DIAGNOSIS — Z3A36 36 weeks gestation of pregnancy: Secondary | ICD-10-CM

## 2022-09-17 DIAGNOSIS — O99843 Bariatric surgery status complicating pregnancy, third trimester: Secondary | ICD-10-CM

## 2022-09-17 DIAGNOSIS — Z9884 Bariatric surgery status: Secondary | ICD-10-CM

## 2022-09-17 DIAGNOSIS — O99213 Obesity complicating pregnancy, third trimester: Secondary | ICD-10-CM | POA: Diagnosis present

## 2022-09-17 DIAGNOSIS — Z9889 Other specified postprocedural states: Secondary | ICD-10-CM

## 2022-09-17 DIAGNOSIS — O3443 Maternal care for other abnormalities of cervix, third trimester: Secondary | ICD-10-CM

## 2022-09-24 ENCOUNTER — Telehealth (HOSPITAL_COMMUNITY): Payer: Self-pay | Admitting: *Deleted

## 2022-09-24 ENCOUNTER — Ambulatory Visit: Payer: Medicaid Other | Attending: Maternal & Fetal Medicine | Admitting: *Deleted

## 2022-09-24 ENCOUNTER — Ambulatory Visit: Payer: Medicaid Other | Admitting: *Deleted

## 2022-09-24 ENCOUNTER — Ambulatory Visit: Payer: Medicaid Other

## 2022-09-24 VITALS — BP 135/70 | HR 82

## 2022-09-24 DIAGNOSIS — O99213 Obesity complicating pregnancy, third trimester: Secondary | ICD-10-CM | POA: Diagnosis not present

## 2022-09-24 DIAGNOSIS — O99843 Bariatric surgery status complicating pregnancy, third trimester: Secondary | ICD-10-CM | POA: Insufficient documentation

## 2022-09-24 DIAGNOSIS — Z3A36 36 weeks gestation of pregnancy: Secondary | ICD-10-CM | POA: Diagnosis not present

## 2022-09-24 DIAGNOSIS — E669 Obesity, unspecified: Secondary | ICD-10-CM | POA: Diagnosis not present

## 2022-09-24 DIAGNOSIS — Z3A37 37 weeks gestation of pregnancy: Secondary | ICD-10-CM | POA: Diagnosis not present

## 2022-09-24 NOTE — Procedures (Signed)
Marissa Saunders [birth year] 102w6d  Fetus A Non-Stress Test Interpretation for 09/24/22  Indication:  Obesity  Fetal Heart Rate A Mode: External Baseline Rate (A): 145 bpm Variability: Moderate Accelerations: 15 x 15 Decelerations: None Multiple birth?: No  Uterine Activity Mode: Palpation, Toco Contraction Frequency (min): None  Interpretation (Fetal Testing) Nonstress Test Interpretation: Reactive Comments: Dr. Gertie Exon reviewed tracing.

## 2022-09-24 NOTE — Telephone Encounter (Signed)
Preadmission screen  

## 2022-09-25 ENCOUNTER — Encounter (HOSPITAL_COMMUNITY): Payer: Self-pay

## 2022-09-26 ENCOUNTER — Telehealth (HOSPITAL_COMMUNITY): Payer: Self-pay | Admitting: *Deleted

## 2022-09-26 ENCOUNTER — Encounter (HOSPITAL_COMMUNITY): Payer: Self-pay | Admitting: *Deleted

## 2022-09-26 NOTE — Telephone Encounter (Signed)
Preadmission screen  

## 2022-10-01 ENCOUNTER — Ambulatory Visit (HOSPITAL_BASED_OUTPATIENT_CLINIC_OR_DEPARTMENT_OTHER): Payer: Medicaid Other | Admitting: *Deleted

## 2022-10-01 ENCOUNTER — Ambulatory Visit: Payer: Medicaid Other | Admitting: *Deleted

## 2022-10-01 VITALS — BP 155/71 | HR 76

## 2022-10-01 VITALS — BP 126/69

## 2022-10-01 DIAGNOSIS — Z3A38 38 weeks gestation of pregnancy: Secondary | ICD-10-CM

## 2022-10-01 DIAGNOSIS — O99213 Obesity complicating pregnancy, third trimester: Secondary | ICD-10-CM | POA: Insufficient documentation

## 2022-10-01 NOTE — Procedures (Signed)
Marissa Saunders 05/14/1991 [redacted]w[redacted]d  Fetus A Non-Stress Test Interpretation for 10/01/22  Indication:  Obesity  Fetal Heart Rate A Mode: External Baseline Rate (A): 145 bpm Variability: Moderate Accelerations: 15 x 15 Decelerations: None Multiple birth?: No  Uterine Activity Mode: Palpation, Toco Contraction Frequency (min): None  Interpretation (Fetal Testing) Nonstress Test Interpretation: Reactive Comments: Dr. Donalee Citrin reviewed tracing.

## 2022-10-02 ENCOUNTER — Inpatient Hospital Stay (HOSPITAL_COMMUNITY)
Admission: RE | Admit: 2022-10-02 | Discharge: 2022-10-03 | DRG: 807 | Disposition: A | Discharge status: Home / Self Care | Payer: Medicaid Other | Attending: Obstetrics | Admitting: Obstetrics

## 2022-10-02 ENCOUNTER — Inpatient Hospital Stay (HOSPITAL_COMMUNITY): Payer: Medicaid Other

## 2022-10-02 ENCOUNTER — Other Ambulatory Visit: Payer: Self-pay

## 2022-10-02 ENCOUNTER — Encounter (HOSPITAL_COMMUNITY): Payer: Self-pay | Admitting: Obstetrics

## 2022-10-02 DIAGNOSIS — O99214 Obesity complicating childbirth: Principal | ICD-10-CM | POA: Diagnosis present

## 2022-10-02 DIAGNOSIS — Z3A39 39 weeks gestation of pregnancy: Secondary | ICD-10-CM

## 2022-10-02 DIAGNOSIS — Z87891 Personal history of nicotine dependence: Secondary | ICD-10-CM

## 2022-10-02 DIAGNOSIS — O99213 Obesity complicating pregnancy, third trimester: Principal | ICD-10-CM

## 2022-10-02 DIAGNOSIS — O99824 Streptococcus B carrier state complicating childbirth: Secondary | ICD-10-CM | POA: Diagnosis present

## 2022-10-02 DIAGNOSIS — O99844 Bariatric surgery status complicating childbirth: Secondary | ICD-10-CM | POA: Diagnosis present

## 2022-10-02 LAB — CBC WITH DIFFERENTIAL/PLATELET
Abs Immature Granulocytes: 0.09 10*3/uL — ABNORMAL HIGH (ref 0.00–0.07)
Basophils Absolute: 0 10*3/uL (ref 0.0–0.1)
Basophils Relative: 0 %
Eosinophils Absolute: 0.1 10*3/uL (ref 0.0–0.5)
Eosinophils Relative: 1 %
HCT: 33 % — ABNORMAL LOW (ref 36.0–46.0)
Hemoglobin: 10.4 g/dL — ABNORMAL LOW (ref 12.0–15.0)
Immature Granulocytes: 1 %
Lymphocytes Relative: 19 %
Lymphs Abs: 2 10*3/uL (ref 0.7–4.0)
MCH: 25.7 pg — ABNORMAL LOW (ref 26.0–34.0)
MCHC: 31.5 g/dL (ref 30.0–36.0)
MCV: 81.5 fL (ref 80.0–100.0)
Monocytes Absolute: 0.8 10*3/uL (ref 0.1–1.0)
Monocytes Relative: 7 %
Neutro Abs: 7.8 10*3/uL — ABNORMAL HIGH (ref 1.7–7.7)
Neutrophils Relative %: 72 %
Platelets: 263 10*3/uL (ref 150–400)
RBC: 4.05 MIL/uL (ref 3.87–5.11)
RDW: 14.7 % (ref 11.5–15.5)
WBC: 10.7 10*3/uL — ABNORMAL HIGH (ref 4.0–10.5)
nRBC: 0 % (ref 0.0–0.2)

## 2022-10-02 LAB — TYPE AND SCREEN
ABO/RH(D): A POS
Antibody Screen: NEGATIVE

## 2022-10-02 LAB — RPR: RPR Ser Ql: NONREACTIVE

## 2022-10-02 LAB — OB RESULTS CONSOLE RPR: RPR: NONREACTIVE

## 2022-10-02 MED ORDER — ONDANSETRON HCL 4 MG/2ML IJ SOLN: 4.0000 mg | INTRAMUSCULAR | Status: DC | PRN

## 2022-10-02 MED ORDER — OXYCODONE HCL 5 MG PO TABS: 5.0000 mg | ORAL_TABLET | ORAL | Status: DC | PRN

## 2022-10-02 MED ORDER — TETANUS-DIPHTH-ACELL PERTUSSIS 5-2.5-18.5 LF-MCG/0.5 IM SUSY: 0.5000 mL | PREFILLED_SYRINGE | Freq: Once | INTRAMUSCULAR | Status: DC

## 2022-10-02 MED ORDER — PRENATAL MULTIVITAMIN CH
1.0000 | ORAL_TABLET | Freq: Every day | ORAL | Status: DC
  Administered 2022-10-03: 1 via ORAL
  Filled 2022-10-02: qty 1

## 2022-10-02 MED ORDER — LACTATED RINGERS IV SOLN: 500.0000 mL | INTRAVENOUS | Status: DC | PRN

## 2022-10-02 MED ORDER — OXYCODONE HCL 5 MG PO TABS: 10.0000 mg | ORAL_TABLET | ORAL | Status: DC | PRN

## 2022-10-02 MED ORDER — WITCH HAZEL-GLYCERIN EX PADS: 1.0000 | MEDICATED_PAD | CUTANEOUS | Status: DC | PRN

## 2022-10-02 MED ORDER — MISOPROSTOL 25 MCG QUARTER TABLET
25.0000 ug | ORAL_TABLET | ORAL | Status: DC | PRN
  Filled 2022-10-02: qty 1

## 2022-10-02 MED ORDER — BENZOCAINE-MENTHOL 20-0.5 % EX AERO
1.0000 | INHALATION_SPRAY | CUTANEOUS | Status: DC | PRN
  Administered 2022-10-02: 1 via TOPICAL
  Filled 2022-10-02: qty 56

## 2022-10-02 MED ORDER — SENNOSIDES-DOCUSATE SODIUM 8.6-50 MG PO TABS
2.0000 | ORAL_TABLET | ORAL | Status: DC
  Administered 2022-10-02 – 2022-10-03 (×2): 2 via ORAL
  Filled 2022-10-02 (×2): qty 2

## 2022-10-02 MED ORDER — ONDANSETRON HCL 4 MG/2ML IJ SOLN: 4.0000 mg | Freq: Four times a day (QID) | INTRAMUSCULAR | Status: DC | PRN

## 2022-10-02 MED ORDER — PENICILLIN G POT IN DEXTROSE 60000 UNIT/ML IV SOLN
3.0000 10*6.[IU] | INTRAVENOUS | Status: DC
  Administered 2022-10-02: 3 10*6.[IU] via INTRAVENOUS
  Filled 2022-10-02: qty 50

## 2022-10-02 MED ORDER — ONDANSETRON HCL 4 MG PO TABS: 4.0000 mg | ORAL_TABLET | ORAL | Status: DC | PRN

## 2022-10-02 MED ORDER — LACTATED RINGERS IV SOLN: INTRAVENOUS | Status: DC

## 2022-10-02 MED ORDER — FENTANYL CITRATE (PF) 100 MCG/2ML IJ SOLN
50.0000 ug | INTRAMUSCULAR | Status: DC | PRN
  Administered 2022-10-02: 100 ug via INTRAVENOUS
  Filled 2022-10-02: qty 2

## 2022-10-02 MED ORDER — SOD CITRATE-CITRIC ACID 500-334 MG/5ML PO SOLN: 30.0000 mL | ORAL | Status: DC | PRN

## 2022-10-02 MED ORDER — OXYTOCIN-SODIUM CHLORIDE 30-0.9 UT/500ML-% IV SOLN
2.5000 [IU]/h | INTRAVENOUS | Status: DC
  Filled 2022-10-02: qty 500

## 2022-10-02 MED ORDER — OXYTOCIN BOLUS FROM INFUSION
333.0000 mL | Freq: Once | INTRAVENOUS | Status: AC
  Administered 2022-10-02: 333 mL via INTRAVENOUS

## 2022-10-02 MED ORDER — IBUPROFEN 600 MG PO TABS
600.0000 mg | ORAL_TABLET | Freq: Four times a day (QID) | ORAL | Status: DC
  Administered 2022-10-02 – 2022-10-03 (×5): 600 mg via ORAL
  Filled 2022-10-02 (×5): qty 1

## 2022-10-02 MED ORDER — SODIUM CHLORIDE 0.9 % IV SOLN
5.0000 10*6.[IU] | Freq: Once | INTRAVENOUS | Status: AC
  Administered 2022-10-02: 5 10*6.[IU] via INTRAVENOUS
  Filled 2022-10-02: qty 5

## 2022-10-02 MED ORDER — TERBUTALINE SULFATE 1 MG/ML IJ SOLN: 0.2500 mg | Freq: Once | INTRAMUSCULAR | Status: DC | PRN

## 2022-10-02 MED ORDER — OXYCODONE-ACETAMINOPHEN 5-325 MG PO TABS: 2.0000 | ORAL_TABLET | ORAL | Status: DC | PRN

## 2022-10-02 MED ORDER — COCONUT OIL OIL: 1.0000 | TOPICAL_OIL | Status: DC | PRN

## 2022-10-02 MED ORDER — SIMETHICONE 80 MG PO CHEW: 80.0000 mg | CHEWABLE_TABLET | ORAL | Status: DC | PRN

## 2022-10-02 MED ORDER — DIBUCAINE (PERIANAL) 1 % EX OINT: 1.0000 | TOPICAL_OINTMENT | CUTANEOUS | Status: DC | PRN

## 2022-10-02 MED ORDER — DIPHENHYDRAMINE HCL 25 MG PO CAPS: 25.0000 mg | ORAL_CAPSULE | Freq: Four times a day (QID) | ORAL | Status: DC | PRN

## 2022-10-02 MED ORDER — ACETAMINOPHEN 325 MG PO TABS
650.0000 mg | ORAL_TABLET | ORAL | Status: DC | PRN
  Administered 2022-10-03: 650 mg via ORAL
  Filled 2022-10-02: qty 2

## 2022-10-02 MED ORDER — LIDOCAINE HCL (PF) 1 % IJ SOLN
30.0000 mL | INTRAMUSCULAR | Status: AC | PRN
  Administered 2022-10-02: 30 mL via SUBCUTANEOUS
  Filled 2022-10-02: qty 30

## 2022-10-02 MED ORDER — OXYCODONE-ACETAMINOPHEN 5-325 MG PO TABS: 1.0000 | ORAL_TABLET | ORAL | Status: DC | PRN

## 2022-10-02 MED ORDER — OXYTOCIN-SODIUM CHLORIDE 30-0.9 UT/500ML-% IV SOLN
1.0000 m[IU]/min | INTRAVENOUS | Status: DC
  Administered 2022-10-02: 2 m[IU]/min via INTRAVENOUS

## 2022-10-02 MED ORDER — ACETAMINOPHEN 325 MG PO TABS: 650.0000 mg | ORAL_TABLET | ORAL | Status: DC | PRN

## 2022-10-02 NOTE — H&P (Signed)
32 y.o. Marissa Saunders:[id number] @ [redacted]w[redacted]d presents for term induction of labor due to class III obesity.  Otherwise has good fetal movement and no bleeding.  Pregnancy complicated by: Pre-pregnancy BMI of 50.  Has been followed by MFM.  Most recent growth [place] 09/17/22 with EFW 7lb 5oz (79% with AC 91%), AFI 17.  Pelvis proven to 7lb 13 oz.  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  Medicaid tubal papers signed on 08/29/22  Past Medical History:  Diagnosis Date   Abnormal Pap smear, low grade squamous intraepithelial lesion (LGSIL) 03/25/2012   GERD (gastroesophageal reflux disease)    Vaginal Pap smear, abnormal     Past Surgical History:  Procedure Laterality Date   ANAL FISSURE REPAIR  05/02/11   LAPAROSCOPIC GASTRIC SLEEVE RESECTION  2016   LEEP N/A 06/12/2020   Procedure: LOOP ELECTROSURGICAL EXCISION PROCEDURE (LEEP);  Surgeon: Allyn Kenner, DO;  Location: [facility];  Service: Gynecology;  Laterality: N/A;    OB History  Gravida Para Term Preterm AB Living  3 2 2     2   SAB IAB Ectopic Multiple Live Births          2    # Outcome Date GA Lbr Len/2nd Weight Sex Delivery Anes PTL Lv  3 Current           2 Term 03/18/20 [redacted]w[redacted]d    Vag-Spont   LIV  1 Term 08/15/12 [redacted]w[redacted]d 05:20 / 00:50 3560 g M Vag-Spont None  LIV    Social History   Socioeconomic History   Marital status: Single    Spouse name: Not on file   Number of children: Not on file   Years of education: 12.5   Highest education level: Not on file  Occupational History   Occupation: Deli clerk/student    Employer: [employer]  Tobacco Use   Smoking status: Former    Types: Cigarettes   Smokeless tobacco: Never  Vaping Use   Vaping Use: Former   Substances: Nicotine, Flavoring  Substance and Sexual Activity   Alcohol use: Not Currently    Comment: social   Drug use: No   Sexual  activity: Yes    Partners: Male    Birth control/protection: None  Other Topics Concern   Not on file  Social History Narrative   Caffienated drinks-no   Seat belt use often-yes   Regular Exercise-no   Smoke alarm in the home-yes   Firearms/guns in the home-no   History of physical abuse-no               Social Determinants of Health   Financial Resource Strain: Not on file  Food Insecurity: No Food Insecurity (10/02/2022)   Hunger Vital Sign    Worried About Running Out of Food in the Last Year: Never true    Ran Out of Food in the Last Year: Never true  Transportation Needs: No Transportation Needs (10/02/2022)   PRAPARE - Hydrologist (Medical): No    Lack of Transportation (Non-Medical): No  Physical Activity: Not on file  Stress: Not on file  Social Connections: Not on file  Intimate Partner Violence: Not At Risk (10/02/2022)   Humiliation, Afraid, Rape, and Kick questionnaire    Fear of Current or Ex-Partner: No    Emotionally Abused: No    Physically Abused: No  Sexually Abused: No   Patient has no known allergies.    Prenatal Transfer Tool  Maternal Diabetes: No Genetic Screening: Normal Maternal Ultrasounds/Referrals: Normal Fetal Ultrasounds or other Referrals:  Referred to Materal Fetal Medicine  Maternal Substance Abuse:  No Significant Maternal Medications:  None Significant Maternal Lab Results: Group B Strep positive  ABO, Rh: A/Positive/-- (09/20 0000) Antibody: Negative (09/20 0000) Rubella: Immune (09/20 0000) RPR: Nonreactive (09/20 0000)  HBsAg: Negative (09/20 0000)  HIV: Non-reactive (09/20 0000)  GBS:   GBS positive     Vitals:   10/02/22 0616 10/02/22 0619  BP: (!) 100/59   Pulse: 75   Resp: 17   Temp: 98.2 F (36.8 C)   SpO2: 98% 99%     General:  NAD Abdomen:  soft, gravid, EFW 7.5-8# Ex:  1+ edema SVE:  3/50/-3 FHTs:  150s, moderate variability, category 1 Toco:  quiet   A/P   32 y.o.  [id number] [redacted]w[redacted]d presents with term IOL due to class III obesity IOL:  Will start pitocin, AROM when able GBS positive: pcn Undesired fertility:  reviewed goals--she reaffirms desire for permanent sterilization (via bilateral salpingectomy if possible) if unscheduled cesarean delivery is required.  She desires even if emergent cesarean delivery.  She understands the risks to include infection, bleeding, damage to surrounding structures (including but not limited to bowel, bladder, ovaries, nerves, vessels), tubal failure resulting in pregnancy, including risk of ectopic pregnancy, need for additional procedures, risk of regret.      [place]

## 2022-10-02 NOTE — Progress Notes (Signed)
Patient reports some cramping  BP 124/87   Pulse 78   Temp 98.2 F (36.8 C) (Oral)   Resp 17   Ht 5\' 5"  (1.651 m)   Wt (!) 149.7 kg   LMP 12/22/2021   SpO2 100%   BMI 54.91 kg/m   Toco: uterine irritability EFM:  150s, moderate variability, but difficult tracing due to habitus SVE: 4/50/-2, AROM copious clear fluid FSE placed due to difficulty with continuous EFM  Following AROM, deep variables to the 90s while suping that lessened with lateral position change  Will monitor closely.  Variability remains moderate Anticipate SVD

## 2022-10-02 NOTE — Lactation Note (Signed)
This note was copied from a baby's chart. Lactation Consultation Note  Patient Name: Marissa Saunders [id number] Date: 10/02/2022 Age:32 hours   Per RN Jerene Pitch),  Birth Parent and infant are currently asleep. RN will ask Birth Parent if she would like to be seen on MBU by [facility] services at her next assessment. Birth Parent had declined [facility] services in L&D.   Maternal Data    Feeding Nipple Type: Regular  LATCH Score                    Lactation Tools Discussed/Used    Interventions    Discharge    Consult Status      [name] 10/02/2022, 11:37 PM

## 2022-10-03 LAB — CBC
HCT: 28.5 % — ABNORMAL LOW (ref 36.0–46.0)
Hemoglobin: 9.1 g/dL — ABNORMAL LOW (ref 12.0–15.0)
MCH: 25.9 pg — ABNORMAL LOW (ref 26.0–34.0)
MCHC: 31.9 g/dL (ref 30.0–36.0)
MCV: 81 fL (ref 80.0–100.0)
Platelets: 239 10*3/uL (ref 150–400)
RBC: 3.52 MIL/uL — ABNORMAL LOW (ref 3.87–5.11)
RDW: 14.8 % (ref 11.5–15.5)
WBC: 13 10*3/uL — ABNORMAL HIGH (ref 4.0–10.5)
nRBC: 0 % (ref 0.0–0.2)

## 2022-10-03 MED ORDER — IBUPROFEN 800 MG PO TABS: 800.0000 mg | ORAL_TABLET | Freq: Three times a day (TID) | ORAL | 1 refills | Status: DC | PRN

## 2022-10-03 NOTE — Progress Notes (Signed)
POSTPARTUM PROGRESS NOTE  Post Partum Day #1  Subjective:  No acute events overnight.  Pt denies problems with ambulating, voiding or po intake.  She denies nausea or vomiting.  Pain is well controlled.  Lochia Minimal.   Objective: Blood pressure 134/66, pulse 70, temperature 98.1 F (36.7 C), temperature source Oral, resp. rate 16, height 5\' 5"  (1.651 m), weight (!) 149.7 kg, last menstrual period 12/22/2021, SpO2 99 %, unknown if currently breastfeeding.  Physical Exam:  General: alert, cooperative and no distress Lochia:normal flow Chest: CTAB Heart: RRR no m/r/g Abdomen: +BS, soft, nontender Uterine Fundus: firm, 1cm below umbilicus GU: suture intact, healing well, no purulent drainage Extremities: neg edema, neg calf TTP BL, neg Homans BL  Recent Labs    10/02/22 0715 10/03/22 0418  HGB 10.4* 9.1*  HCT 33.0* 28.5*    Assessment/Plan:  ASSESSMENT: Marissa Saunders is a 32 y.o. [id number] s/p SVD @ [redacted]w[redacted]d. PNC c/b GBS pos, BMI 55, h/o prior sleeve.   Discharge home and Breastfeeding Declining circ, bottle feeding    LOS: 1 day

## 2022-10-03 NOTE — Discharge Summary (Signed)
Postpartum Discharge Summary  Date of Service updated     Patient Name: Marissa Saunders DOB: [birth year] MRN: [id number]  Date of admission: 10/02/2022 Delivery date:10/02/2022  Delivering provider: Jerelyn Charles  Date of discharge: 10/03/2022  Admitting diagnosis: Maternal obesity, antepartum, third trimester [O99.213] Intrauterine pregnancy: [redacted]w[redacted]d     Secondary diagnosis:  Principal Problem:   Maternal obesity, antepartum, third trimester  Additional problems: BMI 55    Discharge diagnosis: Term Pregnancy Delivered                                              Post partum procedures: none Augmentation: AROM and Pitocin Complications: None  Hospital course: Induction of Labor With Vaginal Delivery   31 y.o. yo [phone] at [redacted]w[redacted]d was admitted to the hospital 10/02/2022 for induction of labor.  Indication for induction:  per MFM for BMI .  Patient had an labor course complicated bynone Membrane Rupture Time/Date: 10:45 AM ,10/02/2022   Delivery Method:Vaginal, Spontaneous  Episiotomy: None  Lacerations:    Details of delivery can be found in separate delivery note.  Patient had a postpartum course complicated by none. Patient is discharged home 10/03/22.  Newborn Data: Birth date:10/02/2022  Birth time:4:10 PM  Gender:Female  Living status:Living  Apgars:8 ,9  Weight:3720 g    Physical exam  Vitals:   10/02/22 1834 10/02/22 2242 10/03/22 0227 10/03/22 0635  BP: (!) 118/47 (!) 125/59 133/73 134/66  Pulse: 87 76 68 70  Resp: 17 16  16   Temp: 98.2 F (36.8 C) 97.7 F (36.5 C) 98 F (36.7 C) 98.1 F (36.7 C)  TempSrc: Oral Oral Oral Oral  SpO2: 100% 99% 99% 99%  Weight:      Height:       General: alert, cooperative, and no distress Lochia: appropriate Uterine Fundus: firm Incision: N/A DVT Evaluation: No evidence of DVT seen on physical exam. Negative Homan's sign. No cords or calf tenderness. Labs: Lab Results  Component Value Date   WBC 13.0 (H) 10/03/2022   HGB  9.1 (L) 10/03/2022   HCT 28.5 (L) 10/03/2022   MCV 81.0 10/03/2022   PLT 239 10/03/2022      Latest Ref Rng & Units 03/18/2020    2:01 AM  CMP  Glucose 70 - 99 mg/dL 103   BUN 6 - 20 mg/dL 10   Creatinine 0.44 - 1.00 mg/dL 0.58   Sodium 135 - 145 mmol/L 136   Potassium 3.5 - 5.1 mmol/L 4.2   Chloride 98 - 111 mmol/L 105   CO2 22 - 32 mmol/L 23   Calcium 8.9 - 10.3 mg/dL 8.8   Total Protein 6.5 - 8.1 g/dL 6.2   Total Bilirubin 0.3 - 1.2 mg/dL 0.5   Alkaline Phos 38 - 126 U/L 129   AST 15 - 41 U/L 19   ALT 0 - 44 U/L 12    Edinburgh Score:    03/18/2020    8:36 PM  Edinburgh Postnatal Depression Scale Screening Tool  I have been able to laugh and see the funny side of things. 0  I have looked forward with enjoyment to things. 0  I have blamed myself unnecessarily when things went wrong. 2  I have been anxious or worried for no good reason. 2  I have felt scared or panicky for no good reason. 0  Things have  been getting on top of me. 0  I have been so unhappy that I have had difficulty sleeping. 0  I have felt sad or miserable. 0  I have been so unhappy that I have been crying. 0  The thought of harming myself has occurred to me. 0  Edinburgh Postnatal Depression Scale Total 4      After visit meds:  Allergies as of 10/03/2022   No Known Allergies      Medication List     TAKE these medications    ibuprofen 800 MG tablet Commonly known as: ADVIL Take 1 tablet (800 mg total) by mouth every 8 (eight) hours as needed.   prenatal multivitamin Tabs tablet Take 1 tablet by mouth daily at 12 noon.         Discharge home in stable condition Infant Feeding: Bottle Infant Disposition:home with mother Discharge instruction: per After Visit Summary and Postpartum booklet. Activity: Advance as tolerated. Pelvic rest for 6 weeks.  Diet: routine diet Anticipated Birth Control: Unsure Postpartum Appointment:6 weeks Follow up Visit:[facility]   10/03/2022 Deliah Boston, MD

## 2022-10-10 ENCOUNTER — Telehealth (HOSPITAL_COMMUNITY): Payer: Self-pay

## 2022-10-10 NOTE — Telephone Encounter (Signed)
Preadmission testing 

## 2022-10-12 ENCOUNTER — Telehealth (HOSPITAL_COMMUNITY): Payer: Self-pay

## 2022-10-12 NOTE — Telephone Encounter (Signed)
Patient reports feeling good. Patient declines questions/concerns about her health and healing.  Patient reports that baby is doing well.Baby sleeps in a Pack n Play. RN reviewed ABC's of safe sleep with patient. Patient declines any questions or concerns about baby.  EPDS score is 2.  Suann Larry [facility]  Perinatal Services   10/12/22,0933

## 2022-12-19 ENCOUNTER — Encounter (HOSPITAL_COMMUNITY): Payer: Self-pay | Admitting: Obstetrics and Gynecology

## 2022-12-19 ENCOUNTER — Other Ambulatory Visit: Payer: Self-pay

## 2022-12-19 NOTE — Progress Notes (Signed)
SDW call  Patient was given pre-op instructions over the phone. Patient verbalized understanding of instructions provided.    PCP - Denies Cardiologist -  Pulmonary:    PPM/ICD - denies  Chest x-ray - n/a EKG -  n/a Stress Test - ECHO -  Cardiac Cath -   Sleep Study/sleep apnea/CPAP: denies  Non-diabetic   Blood Thinner Instructions: denies Aspirin Instructions:denies   ERAS Protcol - No, NPO PRE-SURGERY Ensure or G2-    COVID TEST- n/a    Anesthesia review: No   Patient denies shortness of breath, fever, cough and chest pain over the phone call  Your procedure is scheduled on Monday December 23, 2022  Report to [facility] Main Entrance "A" at  0530 A.M., then check in with the Admitting office.  Call this number if you have problems the morning of surgery:  [phone]   If you have any questions prior to your surgery date call [phone]: Open Monday-Friday 8am-4pm If you experience any cold or flu symptoms such as cough, fever, chills, shortness of breath, etc. between now and your scheduled surgery, please notify us at the above number    Remember:  Do not eat or drink atfter midnight the night before your surgery  Take these medicines the morning of surgery with A SIP OF WATER: None  As of today, STOP taking any Aspirin (unless otherwise instructed by your surgeon) Aleve, Naproxen, Ibuprofen, Motrin, Advil, Goody's, BC's, all herbal medications, fish oil, and all vitamins.

## 2022-12-22 NOTE — Anesthesia Preprocedure Evaluation (Addendum)
Anesthesia Evaluation  Patient identified by MRN, date of birth, ID band Patient awake    Reviewed: Allergy & Precautions, H&P , NPO status , Patient's Chart, lab work & pertinent test results  History of Anesthesia Complications Negative for: history of anesthetic complications  Airway Mallampati: I  TM Distance: >3 FB Neck ROM: Full    Dental no notable dental hx.    Pulmonary former smoker   Pulmonary exam normal breath sounds clear to auscultation       Cardiovascular negative cardio ROS Normal cardiovascular exam Rhythm:Regular Rate:Normal     Neuro/Psych negative neurological ROS  negative psych ROS   GI/Hepatic Neg liver ROS,GERD  Controlled,,  Endo/Other    Morbid obesity  Renal/GU negative Renal ROS  negative genitourinary   Musculoskeletal negative musculoskeletal ROS (+)    Abdominal   Peds negative pediatric ROS (+)  Hematology negative hematology ROS (+)   Anesthesia Other Findings   Reproductive/Obstetrics negative OB ROS                             Anesthesia Physical Anesthesia Plan  ASA: 3  Anesthesia Plan: General   Post-op Pain Management: Toradol IV (intra-op)* and Tylenol PO (pre-op)*   Induction: Intravenous  PONV Risk Score and Plan: 4 or greater and Ondansetron, Dexamethasone, Midazolam and Scopolamine patch - Pre-op  Airway Management Planned: Oral ETT  Additional Equipment:   Intra-op Plan:   Post-operative Plan: Extubation in OR  Informed Consent: I have reviewed the patients History and Physical, chart, labs and discussed the procedure including the risks, benefits and alternatives for the proposed anesthesia with the patient or authorized representative who has indicated his/her understanding and acceptance.     Dental advisory given  Plan Discussed with: CRNA and Anesthesiologist  Anesthesia Plan Comments: ( )        Anesthesia  Quick Evaluation

## 2022-12-23 ENCOUNTER — Encounter (HOSPITAL_COMMUNITY): Payer: Self-pay | Admitting: Obstetrics and Gynecology

## 2022-12-23 ENCOUNTER — Ambulatory Visit (HOSPITAL_COMMUNITY): Payer: Medicaid Other | Admitting: Anesthesiology

## 2022-12-23 ENCOUNTER — Ambulatory Visit (HOSPITAL_BASED_OUTPATIENT_CLINIC_OR_DEPARTMENT_OTHER): Payer: Medicaid Other | Admitting: Anesthesiology

## 2022-12-23 ENCOUNTER — Other Ambulatory Visit: Payer: Self-pay

## 2022-12-23 ENCOUNTER — Encounter (HOSPITAL_COMMUNITY)
Admission: RE | Disposition: A | Discharge status: Home / Self Care | Payer: Self-pay | Source: Home / Self Care | Attending: Obstetrics and Gynecology

## 2022-12-23 ENCOUNTER — Ambulatory Visit (HOSPITAL_COMMUNITY)
Admission: RE | Admit: 2022-12-23 | Discharge: 2022-12-23 | Disposition: A | Discharge status: Home / Self Care | Payer: Medicaid Other | Attending: Obstetrics and Gynecology | Admitting: Obstetrics and Gynecology

## 2022-12-23 DIAGNOSIS — Z6841 Body Mass Index (BMI) 40.0 and over, adult: Secondary | ICD-10-CM | POA: Insufficient documentation

## 2022-12-23 DIAGNOSIS — Z302 Encounter for sterilization: Secondary | ICD-10-CM | POA: Diagnosis not present

## 2022-12-23 DIAGNOSIS — Z01818 Encounter for other preprocedural examination: Secondary | ICD-10-CM

## 2022-12-23 DIAGNOSIS — Z87891 Personal history of nicotine dependence: Secondary | ICD-10-CM | POA: Diagnosis not present

## 2022-12-23 HISTORY — PX: LAPAROSCOPIC BILATERAL SALPINGECTOMY: SHX5889

## 2022-12-23 LAB — TYPE AND SCREEN
ABO/RH(D): A POS
Antibody Screen: NEGATIVE

## 2022-12-23 LAB — POCT PREGNANCY, URINE: Preg Test, Ur: NEGATIVE

## 2022-12-23 LAB — CBC
HCT: 34.3 % — ABNORMAL LOW (ref 36.0–46.0)
Hemoglobin: 10.9 g/dL — ABNORMAL LOW (ref 12.0–15.0)
MCH: 27.8 pg (ref 26.0–34.0)
MCHC: 31.8 g/dL (ref 30.0–36.0)
MCV: 87.5 fL (ref 80.0–100.0)
Platelets: 237 10*3/uL (ref 150–400)
RBC: 3.92 MIL/uL (ref 3.87–5.11)
RDW: 15.5 % (ref 11.5–15.5)
WBC: 7.2 10*3/uL (ref 4.0–10.5)
nRBC: 0 % (ref 0.0–0.2)

## 2022-12-23 LAB — HCG, QUANTITATIVE, PREGNANCY: hCG, Beta Chain, Quant, S: <1 m[IU]/mL (ref <5)

## 2022-12-23 SURGERY — SALPINGECTOMY, BILATERAL, LAPAROSCOPIC
Anesthesia: General | Laterality: Bilateral

## 2022-12-23 MED ORDER — CHLORHEXIDINE GLUCONATE 0.12 % MT SOLN: 15.0000 mL | Freq: Once | OROMUCOSAL | Status: AC

## 2022-12-23 MED ORDER — FENTANYL CITRATE (PF) 250 MCG/5ML IJ SOLN
INTRAMUSCULAR | Status: AC
  Filled 2022-12-23: qty 5

## 2022-12-23 MED ORDER — SODIUM CHLORIDE 0.9 % IR SOLN
Status: DC | PRN
  Administered 2022-12-23: 1000 mL

## 2022-12-23 MED ORDER — SUGAMMADEX SODIUM 200 MG/2ML IV SOLN
INTRAVENOUS | Status: DC | PRN
  Administered 2022-12-23: 300 mg via INTRAVENOUS

## 2022-12-23 MED ORDER — OXYCODONE HCL 5 MG/5ML PO SOLN: 5.0000 mg | Freq: Once | ORAL | Status: DC | PRN

## 2022-12-23 MED ORDER — MIDAZOLAM HCL 2 MG/2ML IJ SOLN
INTRAMUSCULAR | Status: AC
  Filled 2022-12-23: qty 2

## 2022-12-23 MED ORDER — DEXAMETHASONE SODIUM PHOSPHATE 10 MG/ML IJ SOLN
INTRAMUSCULAR | Status: DC | PRN
  Administered 2022-12-23: 10 mg via INTRAVENOUS

## 2022-12-23 MED ORDER — ROCURONIUM BROMIDE 10 MG/ML (PF) SYRINGE
PREFILLED_SYRINGE | INTRAVENOUS | Status: DC | PRN
  Administered 2022-12-23: 70 mg via INTRAVENOUS

## 2022-12-23 MED ORDER — PROPOFOL 10 MG/ML IV BOLUS
INTRAVENOUS | Status: AC
  Filled 2022-12-23: qty 20

## 2022-12-23 MED ORDER — AMISULPRIDE (ANTIEMETIC) 5 MG/2ML IV SOLN: 10.0000 mg | Freq: Once | INTRAVENOUS | Status: DC | PRN

## 2022-12-23 MED ORDER — BUPIVACAINE HCL (PF) 0.5 % IJ SOLN
INTRAMUSCULAR | Status: AC
  Filled 2022-12-23: qty 30

## 2022-12-23 MED ORDER — ACETAMINOPHEN 500 MG PO TABS: 1000.0000 mg | ORAL_TABLET | Freq: Once | ORAL | Status: AC

## 2022-12-23 MED ORDER — POVIDONE-IODINE 10 % EX SWAB: 2.0000 | Freq: Once | CUTANEOUS | Status: DC

## 2022-12-23 MED ORDER — PROPOFOL 10 MG/ML IV BOLUS
INTRAVENOUS | Status: DC | PRN
  Administered 2022-12-23: 180 mg via INTRAVENOUS

## 2022-12-23 MED ORDER — ACETAMINOPHEN 10 MG/ML IV SOLN: 1000.0000 mg | Freq: Once | INTRAVENOUS | Status: DC | PRN

## 2022-12-23 MED ORDER — FENTANYL CITRATE (PF) 100 MCG/2ML IJ SOLN
INTRAMUSCULAR | Status: AC
  Filled 2022-12-23: qty 2

## 2022-12-23 MED ORDER — BUPIVACAINE HCL (PF) 0.5 % IJ SOLN
INTRAMUSCULAR | Status: DC | PRN
  Administered 2022-12-23: 9 mL

## 2022-12-23 MED ORDER — SCOPOLAMINE 1 MG/3DAYS TD PT72
MEDICATED_PATCH | TRANSDERMAL | Status: AC
  Administered 2022-12-23: 1.5 mg via TRANSDERMAL
  Filled 2022-12-23: qty 1

## 2022-12-23 MED ORDER — LIDOCAINE 2% (20 MG/ML) 5 ML SYRINGE
INTRAMUSCULAR | Status: DC | PRN
  Administered 2022-12-23: 100 mg via INTRAVENOUS

## 2022-12-23 MED ORDER — ORAL CARE MOUTH RINSE: 15.0000 mL | Freq: Once | OROMUCOSAL | Status: AC

## 2022-12-23 MED ORDER — CHLORHEXIDINE GLUCONATE 0.12 % MT SOLN
OROMUCOSAL | Status: AC
  Administered 2022-12-23: 15 mL via OROMUCOSAL
  Filled 2022-12-23: qty 15

## 2022-12-23 MED ORDER — OXYCODONE HCL 5 MG PO TABS: 5.0000 mg | ORAL_TABLET | Freq: Once | ORAL | 0 refills | Status: AC | PRN

## 2022-12-23 MED ORDER — ONDANSETRON HCL 4 MG/2ML IJ SOLN
INTRAMUSCULAR | Status: DC | PRN
  Administered 2022-12-23: 4 mg via INTRAVENOUS

## 2022-12-23 MED ORDER — SCOPOLAMINE 1 MG/3DAYS TD PT72: 1.0000 | MEDICATED_PATCH | TRANSDERMAL | Status: DC

## 2022-12-23 MED ORDER — FENTANYL CITRATE (PF) 250 MCG/5ML IJ SOLN
INTRAMUSCULAR | Status: DC | PRN
  Administered 2022-12-23: 100 ug via INTRAVENOUS

## 2022-12-23 MED ORDER — ACETAMINOPHEN 10 MG/ML IV SOLN
INTRAVENOUS | Status: AC
  Filled 2022-12-23: qty 100

## 2022-12-23 MED ORDER — LACTATED RINGERS IV SOLN: INTRAVENOUS | Status: DC

## 2022-12-23 MED ORDER — VASOPRESSIN 20 UNIT/ML IV SOLN
INTRAVENOUS | Status: AC
  Filled 2022-12-23: qty 1

## 2022-12-23 MED ORDER — OXYCODONE HCL 5 MG PO TABS: 5.0000 mg | ORAL_TABLET | Freq: Once | ORAL | Status: DC | PRN

## 2022-12-23 MED ORDER — ACETAMINOPHEN 500 MG PO TABS
ORAL_TABLET | ORAL | Status: AC
  Administered 2022-12-23: 1000 mg via ORAL
  Filled 2022-12-23: qty 2

## 2022-12-23 MED ORDER — MIDAZOLAM HCL 2 MG/2ML IJ SOLN
INTRAMUSCULAR | Status: DC | PRN
  Administered 2022-12-23: 2 mg via INTRAVENOUS

## 2022-12-23 MED ORDER — FENTANYL CITRATE (PF) 100 MCG/2ML IJ SOLN
25.0000 ug | INTRAMUSCULAR | Status: DC | PRN
  Administered 2022-12-23: 50 ug via INTRAVENOUS

## 2022-12-23 SURGICAL SUPPLY — 44 items
ADH SKN CLS APL DERMABOND .7 (GAUZE/BANDAGES/DRESSINGS) ×1
ADH SKN CLS LQ APL DERMABOND (GAUZE/BANDAGES/DRESSINGS) ×1
ADH SKNCLS APL OCTYL .7 VIOL (GAUZE/BANDAGES/DRESSINGS) ×1
ADH SKNCLS LQ APL MCBL BRR (GAUZE/BANDAGES/DRESSINGS) ×1
CABLE HIGH FREQUENCY MONO STRZ (ELECTRODE) IMPLANT
DERMABOND ADVANCED .7 DNX12 (GAUZE/BANDAGES/DRESSINGS) ×1 IMPLANT
DERMABOND ADVANCED .7 DNX6 (GAUZE/BANDAGES/DRESSINGS) IMPLANT
DEVICE TROCAR PUNCTURE CLOSURE (ENDOMECHANICALS) IMPLANT
DRSG OPSITE POSTOP 3X4 (GAUZE/BANDAGES/DRESSINGS) IMPLANT
DURAPREP 26ML APPLICATOR (WOUND CARE) ×1 IMPLANT
FILTER SMOKE EVAC LAPAROSHD (FILTER) IMPLANT
GLOVE BIOGEL PI IND STRL 6.5 (GLOVE) ×2 IMPLANT
GLOVE BIOGEL PI IND STRL 7.0 (GLOVE) ×1 IMPLANT
GLOVE ECLIPSE 6.5 STRL STRAW (GLOVE) ×1 IMPLANT
GOWN STRL REUS W/ TWL LRG LVL3 (GOWN DISPOSABLE) ×2 IMPLANT
GOWN STRL REUS W/TWL LRG LVL3 (GOWN DISPOSABLE) ×2
IRRIG SUCT STRYKERFLOW 2 WTIP (MISCELLANEOUS)
IRRIGATION SUCT STRKRFLW 2 WTP (MISCELLANEOUS) IMPLANT
KIT PINK PAD W/HEAD ARE REST (MISCELLANEOUS) ×1
KIT PINK PAD W/HEAD ARM REST (MISCELLANEOUS) ×1 IMPLANT
KIT TURNOVER KIT B (KITS) ×1 IMPLANT
LIGASURE VESSEL 5MM BLUNT TIP (ELECTROSURGICAL) IMPLANT
NDL INSUFFLATION 14GA 120MM (NEEDLE) ×1 IMPLANT
NEEDLE INSUFFLATION 14GA 120MM (NEEDLE) ×1 IMPLANT
NS IRRIG 1000ML POUR BTL (IV SOLUTION) ×1 IMPLANT
PACK LAPAROSCOPY BASIN (CUSTOM PROCEDURE TRAY) ×1 IMPLANT
PROTECTOR NERVE ULNAR (MISCELLANEOUS) ×2 IMPLANT
SET TUBE SMOKE EVAC HIGH FLOW (TUBING) ×1 IMPLANT
SLEEVE Z-THREAD 5X100MM (TROCAR) ×1 IMPLANT
SOL ELECTROSURG ANTI STICK (MISCELLANEOUS)
SOLUTION ELECTROSURG ANTI STCK (MISCELLANEOUS) IMPLANT
SUT VIC AB 1 CT1 36 (SUTURE) IMPLANT
SUT VICRYL 0 UR6 27IN ABS (SUTURE) ×1 IMPLANT
SUT VICRYL RAPIDE 3 0 (SUTURE) ×1 IMPLANT
SYR 5ML LL (SYRINGE) IMPLANT
SYS BAG RETRIEVAL 10MM (BASKET)
SYSTEM BAG RETRIEVAL 10MM (BASKET) IMPLANT
TOWEL GREEN STERILE FF (TOWEL DISPOSABLE) ×2 IMPLANT
TRAY FOLEY W/BAG SLVR 14FR (SET/KITS/TRAYS/PACK) ×1 IMPLANT
TROCAR 11X100 Z THREAD (TROCAR) ×1 IMPLANT
TROCAR BALLN 12MMX100 BLUNT (TROCAR) IMPLANT
TROCAR XCEL NON-BLD 5MMX100MML (ENDOMECHANICALS) ×1 IMPLANT
TROCAR Z THREAD OPTICAL 12X150 (TROCAR) IMPLANT
WARMER LAPAROSCOPE (MISCELLANEOUS) ×1 IMPLANT

## 2022-12-23 NOTE — H&P (Signed)
32 y.o. [id number] presents for scheduled laparoscopic bilateral salpingectomy for desired permanent sterilization.  Past Medical History:  Diagnosis Date   Abnormal Pap smear, low grade squamous intraepithelial lesion (LGSIL) 03/25/2012   GERD (gastroesophageal reflux disease)    Vaginal Pap smear, abnormal   Morbid Obesity  Past Surgical History:  Procedure Laterality Date   ANAL FISSURE REPAIR  05/02/11   LAPAROSCOPIC GASTRIC SLEEVE RESECTION  2016   LEEP N/A 06/12/2020   Procedure: LOOP ELECTROSURGICAL EXCISION PROCEDURE (LEEP);  Surgeon: Philip Aspen, DO;  Location: [facility];  Service: Gynecology;  Laterality: N/A;    Social History   Socioeconomic History   Marital status: Single    Spouse name: Not on file   Number of children: Not on file   Years of education: 12.5   Highest education level: Not on file  Occupational History   Occupation: Deli clerk/student    Employer: [employer]  Tobacco Use   Smoking status: Former    Types: Cigarettes   Smokeless tobacco: Never  Vaping Use   Vaping Use: Former   Substances: Nicotine, Flavoring  Substance and Sexual Activity   Alcohol use: Not Currently    Comment: social   Drug use: Never   Sexual activity: Yes    Partners: Male    Birth control/protection: None  Other Topics Concern   Not on file  Social History Narrative   Caffienated drinks-no   Seat belt use often-yes   Regular Exercise-no   Smoke alarm in the home-yes   Firearms/guns in the home-no   History of physical abuse-no               Social Determinants of Health   Financial Resource Strain: Not on file  Food Insecurity: No Food Insecurity (10/02/2022)   Hunger Vital Sign    Worried About Running Out of Food in the Last Year: Never true    Ran Out of Food in the Last Year: Never true  Transportation Needs: No Transportation Needs (10/02/2022)   PRAPARE - Administrator, Civil Service (Medical): No    Lack of  Transportation (Non-Medical): No  Physical Activity: Not on file  Stress: Not on file  Social Connections: Not on file  Intimate Partner Violence: Not At Risk (10/02/2022)   Humiliation, Afraid, Rape, and Kick questionnaire    Fear of Current or Ex-Partner: No    Emotionally Abused: No    Physically Abused: No    Sexually Abused: No    No current facility-administered medications on file prior to encounter.   Current Outpatient Medications on File Prior to Encounter  Medication Sig Dispense Refill   ibuprofen (ADVIL) 800 MG tablet Take 1 tablet (800 mg total) by mouth every 8 (eight) hours as needed. (Patient not taking: Reported on 12/17/2022) 60 tablet 1    No Known Allergies  Vitals:   12/23/22 0606  BP: (!) 143/81  Pulse: (!) 56  Resp: 18  Temp: 98.4 F (36.9 C)  TempSrc: Oral  SpO2: 99%  Weight: (!) 140.6 kg  Height: 5\' 5"  (1.651 m)   Per office exam: Lungs: clear to ascultation Cor:  RRR Abdomen:  soft, nontender, nondistended. Ex:  no cords, erythema Pelvic:  deferred to OR  A/P: All risks, benefits and alternatives d/w patient and she desires to proceed.  No abx needed Routine pre-op care  Philip Aspen

## 2022-12-23 NOTE — Anesthesia Procedure Notes (Addendum)
Procedure Name: Intubation Date/Time: 12/23/2022 7:33 AM  Performed by: Alease Medina, CRNAPre-anesthesia Checklist: Patient identified, Emergency Drugs available, Suction available and Patient being monitored Patient Re-evaluated:Patient Re-evaluated prior to induction Oxygen Delivery Method: Circle system utilized Preoxygenation: Pre-oxygenation with 100% oxygen Induction Type: IV induction Ventilation: Mask ventilation without difficulty Laryngoscope Size: Mac and 3 Grade View: Grade I Tube type: Oral Tube size: 7.0 mm Number of attempts: 1 Airway Equipment and Method: Stylet and Oral airway Placement Confirmation: ETT inserted through vocal cords under direct vision, positive ETCO2 and breath sounds checked- equal and bilateral Secured at: 21 cm Tube secured with: Tape Dental Injury: Teeth and Oropharynx as per pre-operative assessment

## 2022-12-23 NOTE — Anesthesia Postprocedure Evaluation (Signed)
Anesthesia Post Note  Patient: Marissa Saunders  Procedure(s) Performed: LAPAROSCOPIC BILATERAL SALPINGECTOMY (Bilateral)     Patient location during evaluation: PACU Anesthesia Type: General Level of consciousness: sedated Pain management: pain level controlled Vital Signs Assessment: post-procedure vital signs reviewed and stable Respiratory status: spontaneous breathing and respiratory function stable Cardiovascular status: stable Postop Assessment: no apparent nausea or vomiting Anesthetic complications: no  No notable events documented.  Last Vitals:  Vitals:   12/23/22 0900 12/23/22 0915  BP: 135/64 (!) 147/83  Pulse: (!) 57 (!) 51  Resp: 19 16  Temp:    SpO2: 100% 100%    Last Pain:  Vitals:   12/23/22 0915  TempSrc:   PainSc: 2                  Floraine Buechler DANIEL

## 2022-12-23 NOTE — Transfer of Care (Signed)
Immediate Anesthesia Transfer of Care Note  Patient: Marissa Saunders  Procedure(s) Performed: LAPAROSCOPIC BILATERAL SALPINGECTOMY (Bilateral)  Patient Location: PACU  Anesthesia Type:General  Level of Consciousness: awake, alert , oriented, and patient cooperative  Airway & Oxygen Therapy: Patient Spontanous Breathing  Post-op Assessment: Report given to RN, Post -op Vital signs reviewed and stable, and Patient moving all extremities X 4  Post vital signs: Reviewed and stable  Last Vitals:  Vitals Value Taken Time  BP 133/71   Temp    Pulse 57 12/23/22 0843  Resp 12 12/23/22 0843  SpO2 100 % 12/23/22 0843  Vitals shown include unvalidated device data.  Last Pain:  Vitals:   12/23/22 0620  TempSrc:   PainSc: 0-No pain         Complications: No notable events documented.

## 2022-12-23 NOTE — Op Note (Signed)
12/23/2022  8:30 AM  PATIENT:  Marissa Saunders  32 y.o. female  PRE-OPERATIVE DIAGNOSIS:  desired permanent sterilization  POST-OPERATIVE DIAGNOSIS:  desired permanent sterilization  PROCEDURE:  Procedure(s): LAPAROSCOPIC BILATERAL SALPINGECTOMY (Bilateral)  SURGEON:  Surgeon(s) and Role:    Philip Aspen, DO - Primary    * Carrington Clamp, MD - Assisting   ANESTHESIA:   local and general  EBL:  5 mL   LOCAL MEDICATIONS USED:  LIDOCAINE  and Amount: 3 ml  FINDINGS: normal appearing uterus, bilateral tubes and ovaries.  No apparent adhesive disease  SPECIMEN:  Source of Specimen:  bilateral fallopian tubes  DISPOSITION OF SPECIMEN:  PATHOLOGY  COUNTS:  YES  PLAN OF CARE: Discharge to home after PACU  PATIENT DISPOSITION:  PACU - hemodynamically stable.   Delay start of Pharmacological VTE agent (>24hrs) due to surgical blood loss or risk of bleeding: not applicable  DESCRIPTION OF PROCEDURE: Patient was taken to the operating room where anesthesia was administered and found to be adequate.  Patient was placed in dorsal lithotomy position and foley placed.  Speculum was advanced to visualize cervix which was grasped with a hulk uterine manipulator.    An infraumbilical 10mm horizontal incision was made after placement of 1cc lidocaine subcutaneously.  A trocar was advanced with camera visualization, but was unable to be be advanced through peritoneum.  After 3 attempted a veress needle was advanced and after confirmation of low pressure, abdomen was insufflated.  Long trocar was then used to advance into abdominopelvic cavity.  5mm ports x 2 were placed in the bilateral lower quadrants under direct visualization. The abdomen and pelvis were visually inspected. A Ligasure was used to transect the mesosalpinx from distal to proximal and removed from uterus at its origin on the right side.  The same was performed on the left.  Excellent hemostasis was observed.  All  instruments were removed and abdomen was desufflated.  The infraumbilical fascia was grasped and the 10mm incision was closed with 2-0 vicryl in a figure of 8.  Digital inspection confirmed closure.  All three skin incision were then close subcuticularly.  Hulka was removed from patients cervix.  Patient tolerated the procedure well, sponge/lap and needle counts were correct x 2.  Patient was taken to recovery in stable condition.

## 2022-12-24 ENCOUNTER — Encounter (HOSPITAL_COMMUNITY): Payer: Self-pay | Admitting: Obstetrics and Gynecology

## 2022-12-24 LAB — SURGICAL PATHOLOGY

## 2023-08-06 IMAGING — US US OB TRANSVAGINAL
1 series · 15 of 28 positions shown · non-contrast
Comparison: 11/18/2021

CLINICAL DATA: Increased bleeding. Estimated gestational age by LMP
is 6 weeks 2 days. Quantitative beta HCG is in process.

EXAM:
TRANSVAGINAL OB ULTRASOUND
TECHNIQUE: Transvaginal ultrasound was performed for complete evaluation of the
gestation as well as the maternal uterus, adnexal regions, and
pelvic cul-de-sac.

[Series 1: us ob transvaginal · 43 acquisitions, 15 frames shown]
[im 1/43]
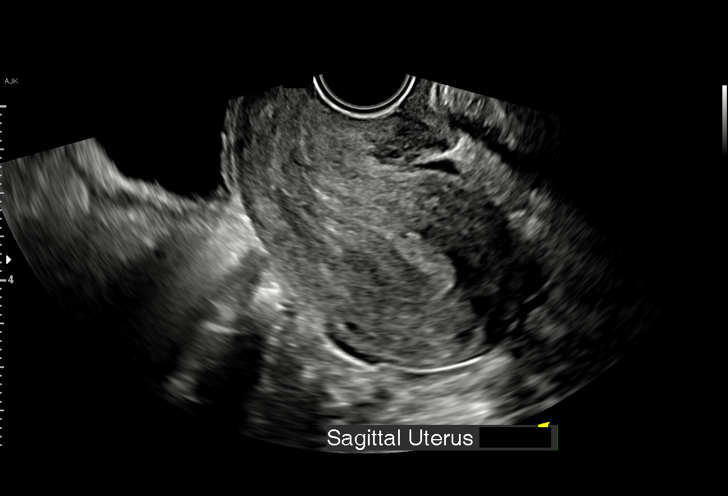
[im 4/43]
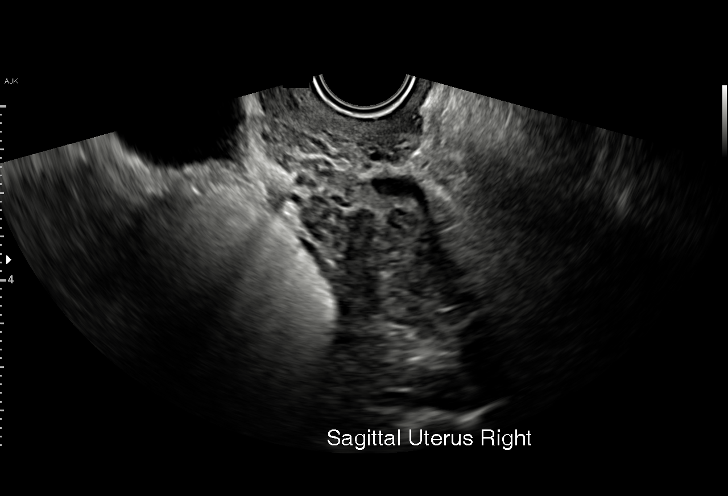
[im 7/43]
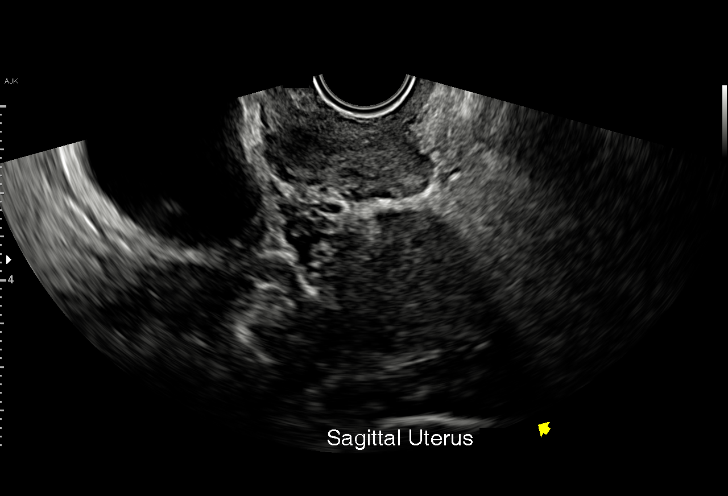
[im 10/43]
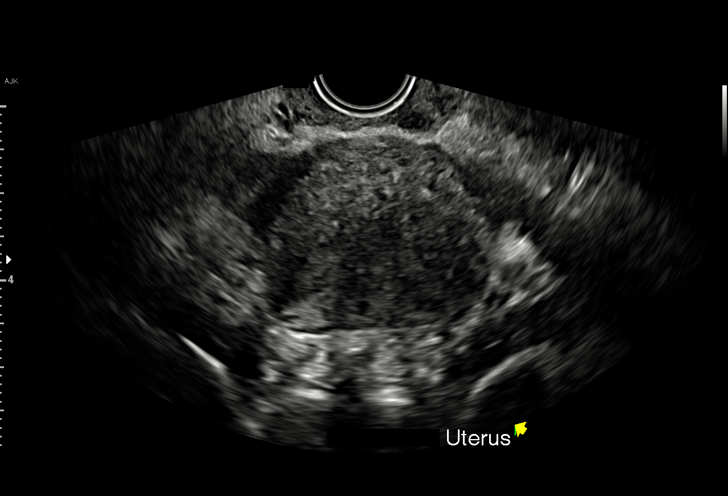
[im 13/43]
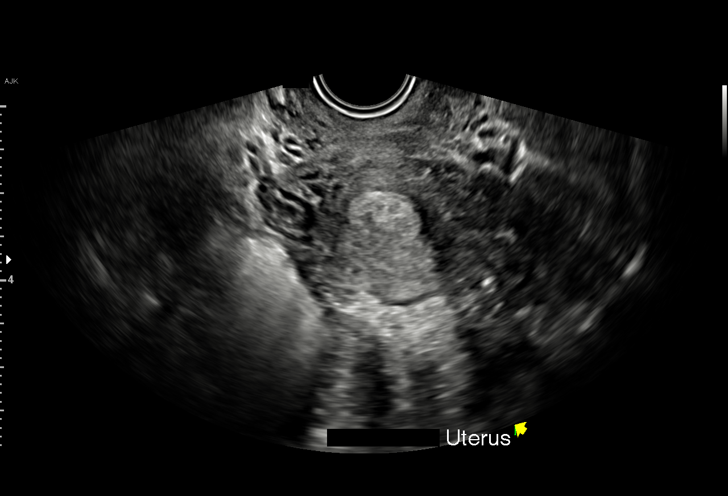
[im 16/43]
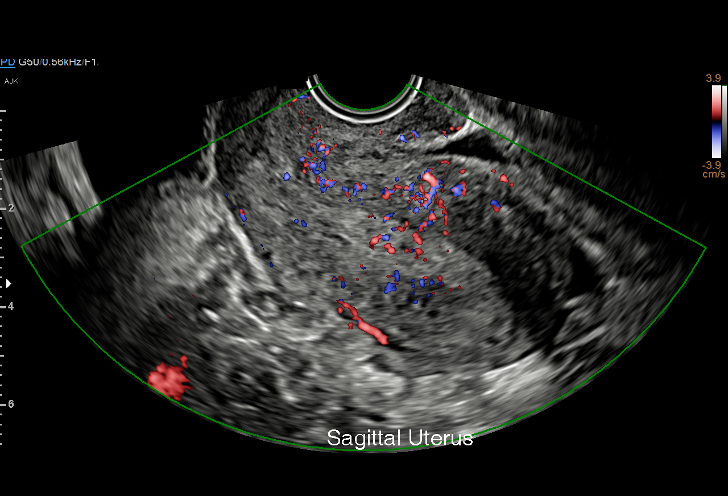
[im 19/43]
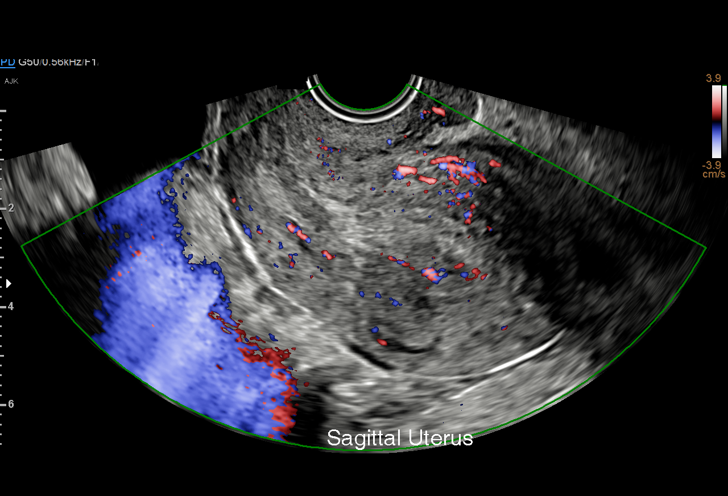
[im 22/43]
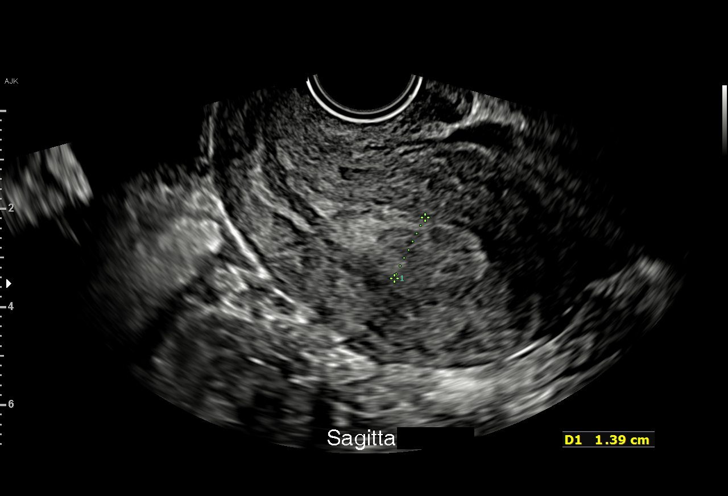
[im 24/43]
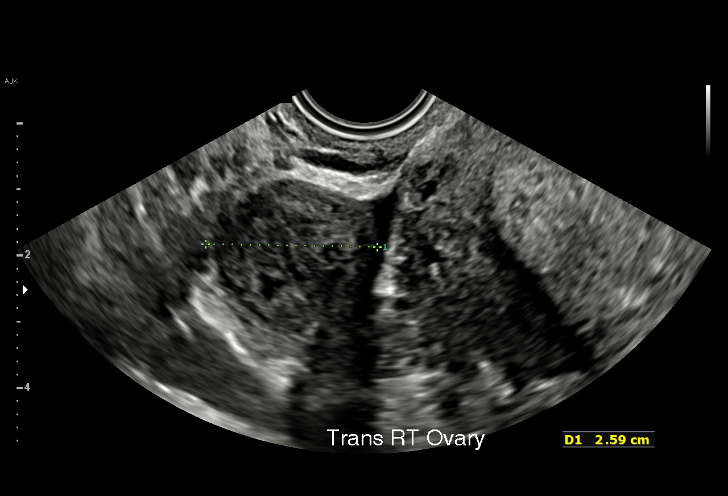
[im 27/43]
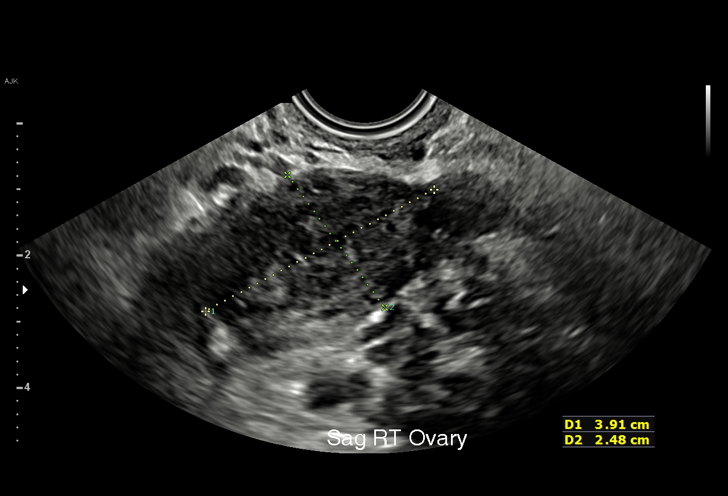
[im 30/43]
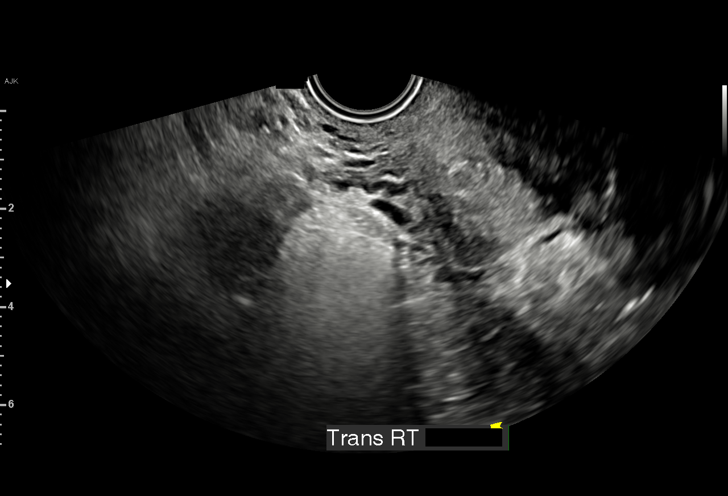
[im 33/43]
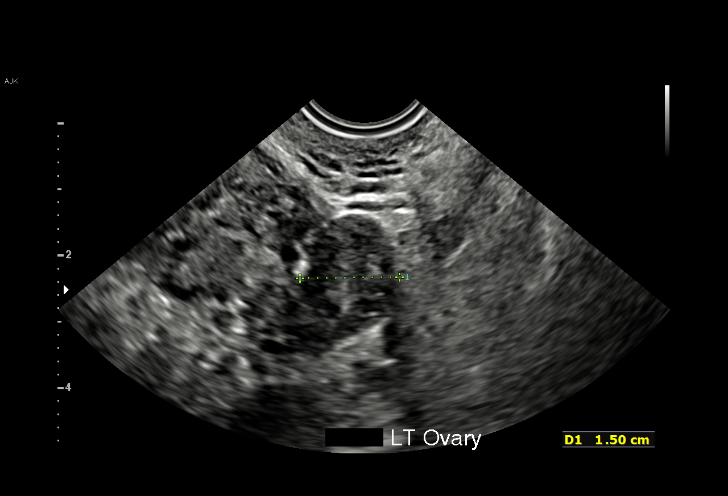
[im 36/43]
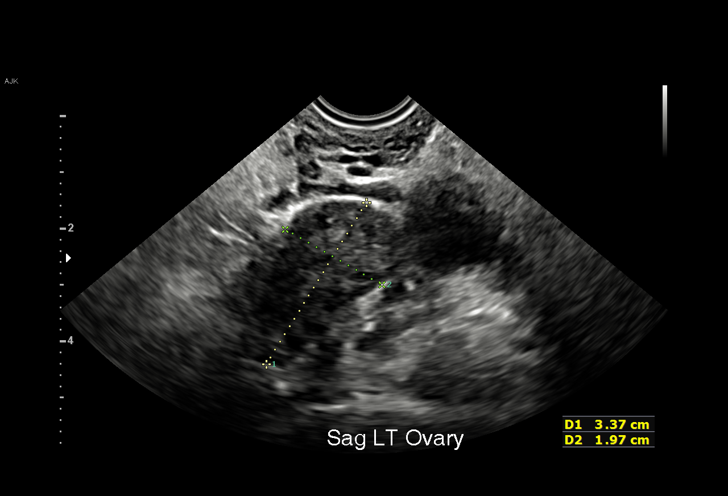
[im 39/43]
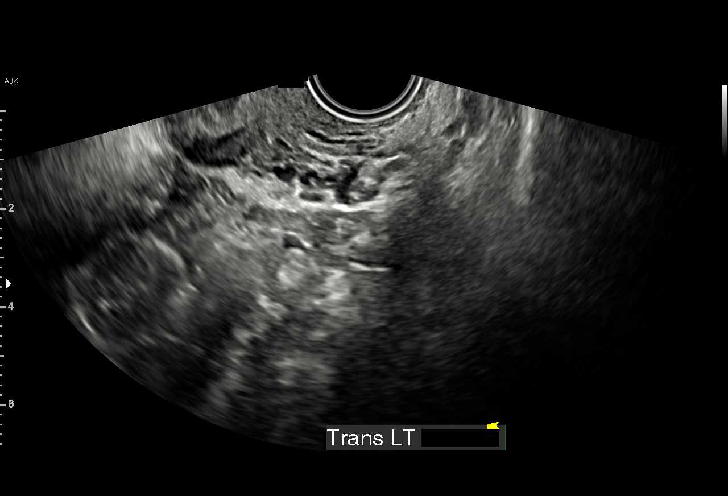
[im 43/43]
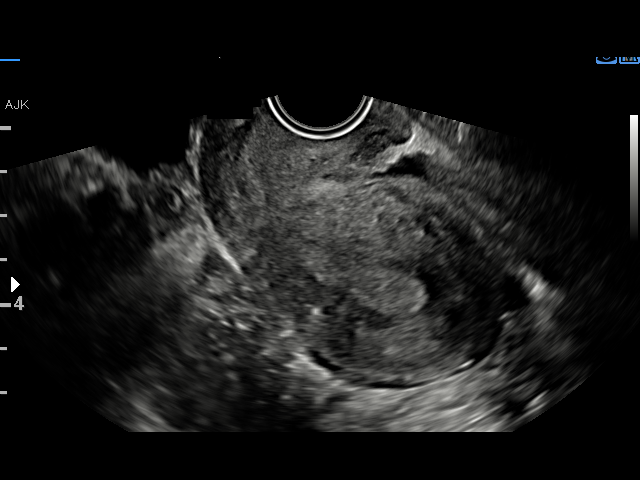

[15 of 28 positions shown; findings below may reference images not displayed]

FINDINGS: Intrauterine gestational sac: No intrauterine gestational sac is
seen on today's examination.

Yolk sac:  Not Visualized.

Embryo:  Not Visualized.

Cardiac Activity: Not Visualized.

Maternal uterus/adnexae: Uterus is retroverted. No myometrial mass
lesions. The endometrium is somewhat heterogeneous with increased
vascularity on color flow Doppler. Endometrial stripe thickness
measures about 12 mm. Both ovaries are visualized and appear normal.
No abnormal adnexal masses. Minimal free fluid in the pelvis.
IMPRESSION: No intrauterine gestational sac is seen on today's study. The
gestational sac seen on the prior study is no longer present
consistent with interval loss of pregnancy.
